# Patient Record
Sex: Female | Born: 1975 | Race: Black or African American | Hispanic: No | Marital: Married | Smoking: Never smoker
Health system: Southern US, Community
[De-identification: ages and names within clinical notes are randomized; demographics above are authoritative.]

## PROBLEM LIST (undated history)

## (undated) DIAGNOSIS — R011 Cardiac murmur, unspecified: Secondary | ICD-10-CM

## (undated) DIAGNOSIS — Z87442 Personal history of urinary calculi: Secondary | ICD-10-CM

## (undated) DIAGNOSIS — D509 Iron deficiency anemia, unspecified: Secondary | ICD-10-CM

## (undated) DIAGNOSIS — T8859XA Other complications of anesthesia, initial encounter: Secondary | ICD-10-CM

## (undated) DIAGNOSIS — I480 Paroxysmal atrial fibrillation: Secondary | ICD-10-CM

## (undated) DIAGNOSIS — E119 Type 2 diabetes mellitus without complications: Secondary | ICD-10-CM

## (undated) DIAGNOSIS — Z8774 Personal history of (corrected) congenital malformations of heart and circulatory system: Secondary | ICD-10-CM

## (undated) DIAGNOSIS — I440 Atrioventricular block, first degree: Secondary | ICD-10-CM

## (undated) DIAGNOSIS — Z7901 Long term (current) use of anticoagulants: Secondary | ICD-10-CM

## (undated) DIAGNOSIS — N200 Calculus of kidney: Secondary | ICD-10-CM

## (undated) DIAGNOSIS — N921 Excessive and frequent menstruation with irregular cycle: Secondary | ICD-10-CM

## (undated) DIAGNOSIS — I10 Essential (primary) hypertension: Secondary | ICD-10-CM

## (undated) DIAGNOSIS — T4145XA Adverse effect of unspecified anesthetic, initial encounter: Secondary | ICD-10-CM

## (undated) DIAGNOSIS — I340 Nonrheumatic mitral (valve) insufficiency: Secondary | ICD-10-CM

## (undated) DIAGNOSIS — Z8632 Personal history of gestational diabetes: Secondary | ICD-10-CM

## (undated) HISTORY — PX: TRANSTHORACIC ECHOCARDIOGRAM: SHX275

## (undated) HISTORY — DX: Essential (primary) hypertension: I10

## (undated) HISTORY — PX: WISDOM TOOTH EXTRACTION: SHX21

## (undated) HISTORY — PX: ASD REPAIR, OSTIUM PRIMUM: SHX1194

---

## 1999-03-14 ENCOUNTER — Emergency Department (HOSPITAL_COMMUNITY): Admission: EM | Admit: 1999-03-14 | Discharge: 1999-03-14 | Payer: Self-pay

## 1999-03-21 ENCOUNTER — Emergency Department (HOSPITAL_COMMUNITY): Admission: EM | Admit: 1999-03-21 | Discharge: 1999-03-21 | Payer: Self-pay | Admitting: Emergency Medicine

## 1999-03-24 ENCOUNTER — Emergency Department (HOSPITAL_COMMUNITY): Admission: EM | Admit: 1999-03-24 | Discharge: 1999-03-24 | Payer: Self-pay | Admitting: Emergency Medicine

## 2001-12-26 ENCOUNTER — Encounter: Admission: RE | Admit: 2001-12-26 | Discharge: 2001-12-26 | Payer: Self-pay | Admitting: Family Medicine

## 2001-12-26 ENCOUNTER — Encounter: Payer: Self-pay | Admitting: Family Medicine

## 2001-12-27 ENCOUNTER — Encounter: Payer: Self-pay | Admitting: Emergency Medicine

## 2001-12-27 ENCOUNTER — Inpatient Hospital Stay (HOSPITAL_COMMUNITY): Admission: EM | Admit: 2001-12-27 | Discharge: 2001-12-28 | Payer: Self-pay | Admitting: Emergency Medicine

## 2004-05-06 ENCOUNTER — Other Ambulatory Visit: Admission: RE | Admit: 2004-05-06 | Discharge: 2004-05-06 | Payer: Self-pay | Admitting: Obstetrics and Gynecology

## 2005-02-24 ENCOUNTER — Inpatient Hospital Stay (HOSPITAL_COMMUNITY): Admission: EM | Admit: 2005-02-24 | Discharge: 2005-02-28 | Payer: Self-pay | Admitting: Emergency Medicine

## 2005-02-25 ENCOUNTER — Encounter (INDEPENDENT_AMBULATORY_CARE_PROVIDER_SITE_OTHER): Payer: Self-pay | Admitting: Cardiovascular Disease

## 2005-03-09 HISTORY — PX: CARDIOVASCULAR STRESS TEST: SHX262

## 2005-04-27 HISTORY — PX: CARDIAC CATHETERIZATION: SHX172

## 2006-07-07 IMAGING — CR DG CHEST 2V
2 series · 2 of 2 positions shown · non-contrast
Comparison: 02/25/05.

CLINICAL DATA: Chest pain and history of congenital heart disease. 
 2-VIEW CHEST:

[w chest pa]
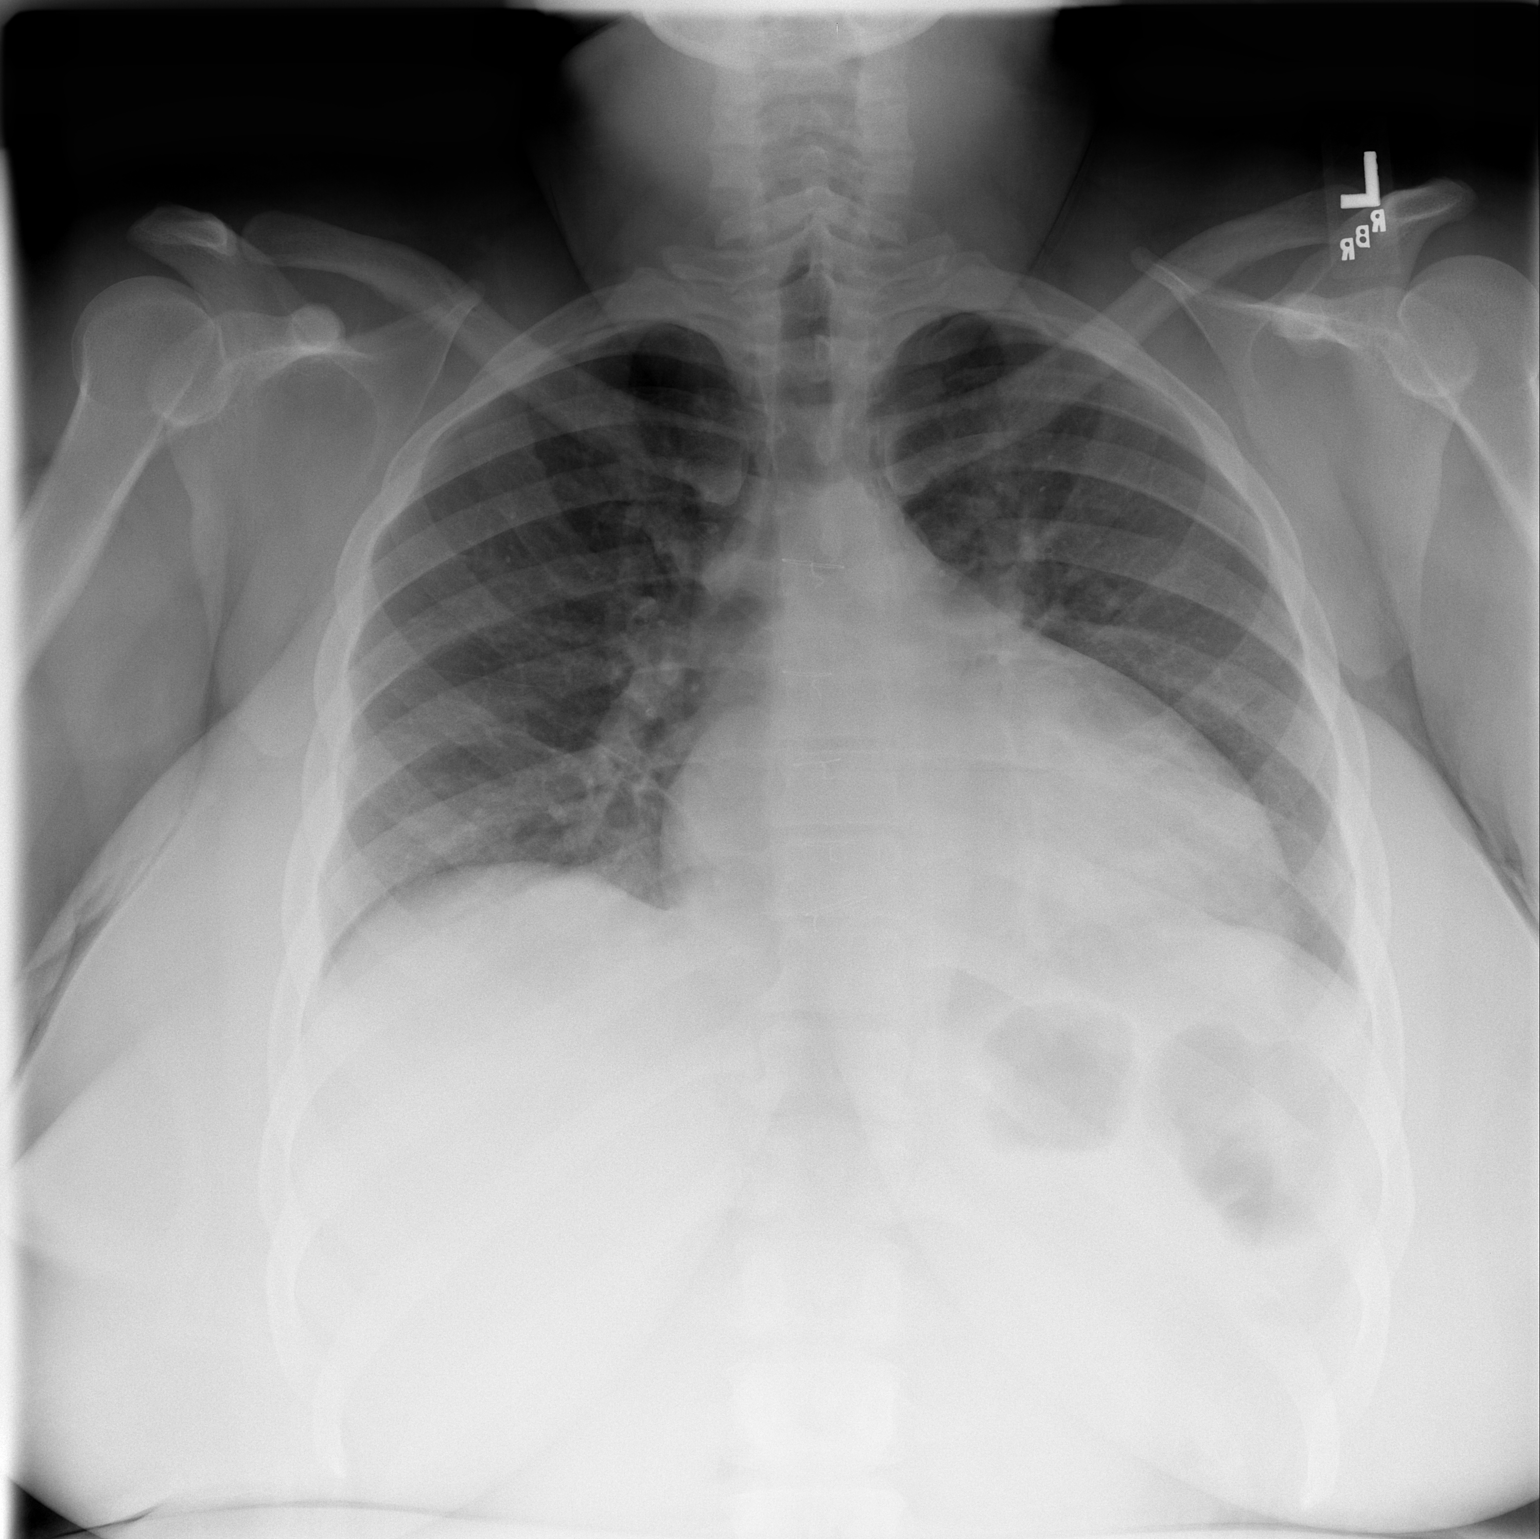

[w chest lat]
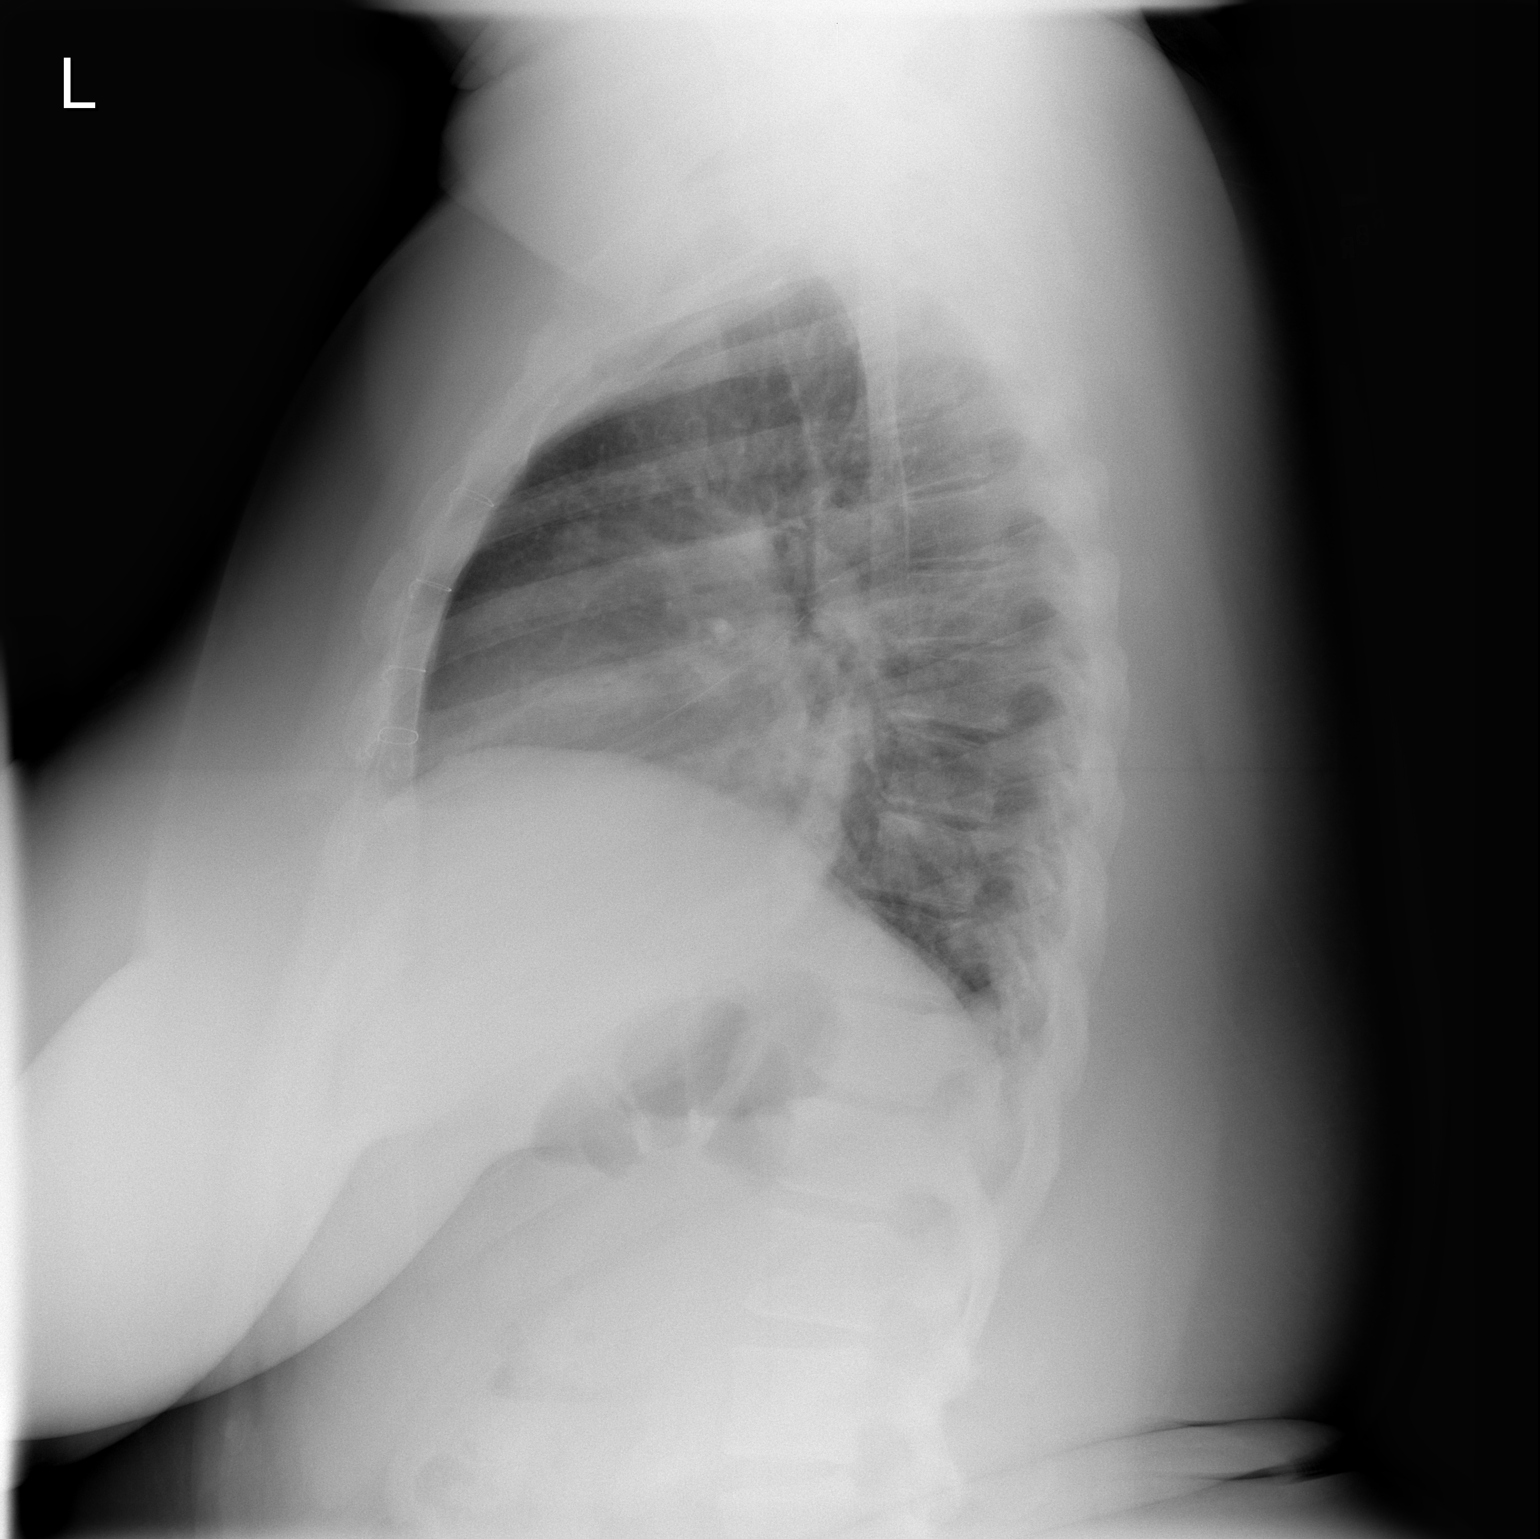

[2 of 2 positions shown; findings below may reference images not displayed]

There are low bilateral lung volumes.  No overt edema.  No pleural effusions.  Underlying cardiomegaly is slightly more pronounced compared to the 02/25/05 film.  The patient has had prior median sternotomy.
IMPRESSION: Cardiomegaly and low lung volumes.  No overt edema.

## 2006-11-07 DIAGNOSIS — Z8632 Personal history of gestational diabetes: Secondary | ICD-10-CM

## 2006-11-07 HISTORY — DX: Personal history of gestational diabetes: Z86.32

## 2007-04-26 ENCOUNTER — Encounter: Admission: RE | Admit: 2007-04-26 | Discharge: 2007-04-26 | Payer: Self-pay | Admitting: Obstetrics and Gynecology

## 2007-07-30 ENCOUNTER — Inpatient Hospital Stay (HOSPITAL_COMMUNITY): Admission: AD | Admit: 2007-07-30 | Discharge: 2007-07-30 | Payer: Self-pay | Admitting: Obstetrics and Gynecology

## 2007-08-02 ENCOUNTER — Inpatient Hospital Stay (HOSPITAL_COMMUNITY): Admission: RE | Admit: 2007-08-02 | Discharge: 2007-08-05 | Payer: Self-pay | Admitting: Obstetrics and Gynecology

## 2007-08-02 ENCOUNTER — Encounter (INDEPENDENT_AMBULATORY_CARE_PROVIDER_SITE_OTHER): Payer: Self-pay | Admitting: Obstetrics and Gynecology

## 2007-08-20 ENCOUNTER — Encounter: Admission: RE | Admit: 2007-08-20 | Discharge: 2007-09-19 | Payer: Self-pay | Admitting: Obstetrics and Gynecology

## 2007-09-20 ENCOUNTER — Encounter: Admission: RE | Admit: 2007-09-20 | Discharge: 2007-10-19 | Payer: Self-pay | Admitting: Obstetrics and Gynecology

## 2007-10-20 ENCOUNTER — Encounter: Admission: RE | Admit: 2007-10-20 | Discharge: 2007-11-11 | Payer: Self-pay | Admitting: Obstetrics and Gynecology

## 2010-11-29 ENCOUNTER — Encounter: Payer: Self-pay | Admitting: Obstetrics and Gynecology

## 2011-01-25 HISTORY — PX: CARDIOVERSION: SHX1299

## 2011-02-04 ENCOUNTER — Ambulatory Visit (HOSPITAL_COMMUNITY)
Admission: RE | Admit: 2011-02-04 | Discharge: 2011-02-04 | Disposition: A | Discharge status: Home / Self Care | Payer: BC Managed Care – PPO | Source: Ambulatory Visit | Attending: Internal Medicine | Admitting: Internal Medicine

## 2011-02-04 DIAGNOSIS — Z7901 Long term (current) use of anticoagulants: Secondary | ICD-10-CM | POA: Insufficient documentation

## 2011-02-04 DIAGNOSIS — R0602 Shortness of breath: Secondary | ICD-10-CM | POA: Insufficient documentation

## 2011-02-04 DIAGNOSIS — I4892 Unspecified atrial flutter: Secondary | ICD-10-CM | POA: Insufficient documentation

## 2011-02-04 LAB — PROTIME-INR: INR: 1.94 — ABNORMAL HIGH (ref 0.00–1.49)

## 2011-02-04 LAB — BASIC METABOLIC PANEL
GFR calc Af Amer: >60 mL/min (ref >60)
GFR calc non Af Amer: >60 mL/min (ref >60)
Potassium: 4.1 mEq/L (ref 3.5–5.1)
Sodium: 138 mEq/L (ref 135–145)

## 2011-02-04 LAB — APTT: aPTT: 50 seconds — ABNORMAL HIGH (ref 24–37)

## 2011-02-04 LAB — CBC: MCV: 81.3 fL (ref 78.0–100.0)

## 2011-02-09 NOTE — Procedures (Signed)
  NAMESINDI, BECKWORTH             ACCOUNT NO.:  [id number]  MEDICAL RECORD NO.:  [id number]           PATIENT TYPE:  O  LOCATION:  [facility]                         FACILITY:  [facility]  PHYSICIAN:  [place] Norvin Ohlin, MD         DATE OF BIRTH:  [birth year]  DATE OF PROCEDURE:  02/04/2011 DATE OF DISCHARGE:  02/04/2011                           CARDIAC CATHETERIZATION   This is an elective cardioversion.  BACKGROUND INFORMATION:  Joy Le is a 35 year old female who had a history of atrial flutter paroxysmally and presented in atrial flutter to the office today feeling of fatigue and somewhat short of breath. She had been on diltiazem for rate control, but is not currently on an antiarrhythmic agent.  She has been therapeutic with Coumadin for approximately 1 year and therefore was referred for elective cardioversion today.  After procedural time-out, the patient was anesthetized per Anesthesia Service with IV anesthesia.  The left AC IV was noted to be infiltrated and a separate IV was placed in the left wrist.  After adequate sedation was achieved, the patient underwent cardioversion with one synchronized shock of 150 joules, biphasic.  The rhythm was noted to convert from atrial flutter to sinus rhythm.  There were no acute complications. Afterwards, the patient was recovered by the anesthesia staff and once able, will be discharged home in a stable condition.  She is asked to follow up with Dr. Royann Shivers in the office next week.     Italy Udell Blasingame, MD     CH/MEDQ  D:  02/04/2011  T:  02/05/2011  Job:  [id number]  cc:   Thurmon Fair, MD  Electronically Signed by Kirtland Bouchard. Almadelia Looman M.D. on 02/09/2011 08:56:12 AM

## 2011-03-22 NOTE — Op Note (Signed)
NAMESEKAI, NAYAK             ACCOUNT NO.:  [id number]   MEDICAL RECORD NO.:  [id number]          PATIENT TYPE:  INP   LOCATION:  9103                          [facility]ACILITY[facility]   PHYSICIAN:  Janine Limbo, M.D.DATE OF BIRTH:  [birth year]   DATE OF PROCEDURE:  08/02/2007  DATE OF DISCHARGE:                               OPERATIVE REPORT   PREOPERATIVE DIAGNOSES:  1. Gestation of 37-1/2 weeks.  2. Insulin-dependent gestational diabetes.  3. Chronic hypertension.  4. Obesity (weight 304 pounds, height 5 feet 2 inches).  5. Breech presentation.  6. Possible intrauterine growth retardation.  7. Asthma.   POSTOPERATIVE DIAGNOSES:  1. Gestation of 37-1/2 weeks.  2. Insulin-dependent gestational diabetes.  3. Chronic hypertension.  4. Obesity (weight 304 pounds, height 5 feet 2 inches).  5. Breech presentation.  6. Possible intrauterine growth retardation.  7. Asthma.   PROCEDURE:  Primary low transverse cesarean section.   SURGEON:  Janine Limbo, M.D.   FIRST ASSISTANT:  Marie L. Williams, C.N.M.   ANESTHETIC:  General.   DISPOSITION:  Ms. Kniskern is a 35 year old female who presents with the  above-mentioned diagnosis.  An amniocentesis was performed and fetal  lung maturity was documented.  We talked about possible external version  and we talked about trying to observe the pregnancy with hopes that the  infant would turn on its own.  The patient considered all of her options  and elected to proceed at this point with a cesarean delivery because of  concerns for poor growth of her infant.  She also understood that it was  unlikely that her infant would turn on her own at this point and that it  was unlikely that we would be successful with an external version.  The  patient understood and accepted the risk of, but not limited to,  anesthetic complications, bleeding, infections, and possible damage to  surrounding organs.   FINDINGS:  A 5 pound 1 ounce female  infant Nehemiah Settle) was delivered from a  frank breech presentation.  The Apgars were 8 at one minute and 9 at  five minutes.  A nuchal cord was present.  The uterus, fallopian tubes,  and the ovaries appeared normal for the gravid state.   PROCEDURE:  The patient was taken to the operating room, where a spinal  anesthetic was given.  The patient's abdomen, perineum, and vagina were  prepped with multiple layers of Betadine.  A Foley catheter was placed  in the bladder.  The patient was then sterilely draped.  The lower  abdomen was injected with 0.5% Marcaine with epinephrine, 10 mL were  used.  A low transverse incision was made in the abdomen and carried  sharply through the subcutaneous tissue, the fascia, and the anterior  peritoneum.  The bladder flap was developed.  An incision was made in  the lower uterine segment and extended in a low transverse fashion.  The  infant was delivered from a frank breech presentation.  The nuchal cord  was reduced.  The cord was clamped and cut and the infant was handed to  the awaiting  pediatric team.  Routine cord blood studies were obtained.  The placenta was removed.  The uterine cavity was cleaned of amniotic  fluid, clotted blood, and membranes.  The uterine incision was then  closed using a running locking suture of 2-0 Vicryl followed by an  imbricating suture of 2-0 Vicryl.  Hemostasis was adequate.  The pelvis  was vigorously irrigated.  The anterior peritoneum and the abdominal  musculature were reapproximated in the midline using 3-0 Vicryl.  The  fascia was closed using a running suture of 0 Vicryl followed by three  interrupted sutures of 0 Vicryl.  A Jackson-Pratt drain was placed in  the subcutaneous layer and brought out through the left lower quadrant.  It was sutured into place using 3-0 silk.  The subcutaneous layer was  closed using a running suture of 0 Vicryl.  The skin was reapproximated  using a subcuticular suture of 3-0  Monocryl.  Sponge, needle, instrument  counts were correct on two occasions.  The estimated blood loss for the  procedure was 600 mL.  The patient tolerated her procedure well.  She  was taken to the recovery room in stable condition.  The infant was  taken to the full-term nursery in stable condition.  The placenta was  sent to pathology.      Janine Limbo, M.D.  Electronically Signed     AVS/MEDQ  D:  08/02/2007  T:  08/03/2007  Job:  [id number]

## 2011-03-22 NOTE — Discharge Summary (Signed)
Joy Le, Joy Le             ACCOUNT NO.:  [id number]   MEDICAL RECORD NO.:  [id number]          PATIENT TYPE:  INP   LOCATION:  9103                          [facility]ILITY[facility]   PHYSICIAN:  Janine Limbo, M.D.DATE OF BIRTH:  Mar 17, 1976   DATE OF ADMISSION:  08/02/2007  DATE OF DISCHARGE:  08/05/2007                               DISCHARGE SUMMARY   ADMISSION DIAGNOSES:  1. Intrauterine pregnancy at 37-1/2 weeks.  2. Chronic hypertension.  3. Diabetes, gestational.  4. Obesity.  5. Intrauterine growth restriction.  6. Breech presentation.   DISCHARGE DIAGNOSES:  1. Intrauterine pregnancy at 37-1/2 weeks.  2. Chronic hypertension.  3. Diabetes, gestational.  4. Obesity.  5. Intrauterine growth restriction.  6. Breech presentation.   HISTORY:  [name] is a 35 year old gravida 1, para 0, at 37-2/7th  weeks, who presents for a cesarean section, secondary to breech  presentation and intrauterine growth retardation.  The patient's history  has also been remarkable for:  1. History of irregular cycles.  2. History of atrial septal defect, repaired as a child.  3. Asthma.  4. 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  5. Migraines.  6. Chronic hypertension, with the patient on Aldomet during this      pregnancy.  7. Toxoplasmosis risk.  8. Moderate to severe mitral regurgitation.  9. Group-B strep positive.   HOSPITAL COURSE:  The patient was taken to the operating room where Dr.  Janine Limbo performed a primary low transverse cesarean section  for a viable female, under spinal anesthesia.  The findings were a 5  pound 1 ounce infant with the name of [name].  Apgars were 8 and 9.  The  infant tolerated the procedure well and was taken to the full-term  nursery.  The mother was taken to the recovery room in good condition.  The placenta was sent to pathology.  There was a JP drain placed in the  left lower quadrant.   On postoperative  day one the patient was doing well.  She was up ad lib.  The blood pressures were in the 120's to 150's/68-84.  Fasting blood  sugar was 102.  A 2-hour PC was 92.  The JP was draining approximately  30 mL.  The Foley was draining clear urine.  She was on Aldomet 500 mg  p.o. twice daily and sliding scale insulin, but none had been required.  Hemoglobin on day one was 11.6, white blood cell count 9.6 and platelet  count was 233.  These were not significantly changed from her pre-  delivery values.  Her JP was draining a moderate amount of  serosanguineous fluid.   By postoperative day two the patient was continuing to do well.  Fasting  blood sugar was 100, 2-hour PCs were in the 140s to 180s.  Her incision  was clean, dry and intact.  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  By  postoperative day three the patient was doing well.  She was up ad lib.  The JP drain was removed by [name], without difficulty.  Her  fasting  blood sugar was 110.  The decision was made to maintain her  Aldomet but discontinue her insulin.  The patient was to check her  fasting blood sugar at home.   DISPOSITION:  The patient was deemed to have received the full benefit  of her hospital stay.  Her incision was clean, dry and intact.  Her  abdomen was soft.  She was discharged home in good condition.   DISCHARGE INSTRUCTIONS:  1. The patient is to monitor her blood sugar on a fasting level every      day.  2. Insulin is to be discontinued.  3. The patient is to monitor for PIH symptoms or any symptoms or      issues with her blood sugar.   DISCHARGE MEDICATIONS:  1. Motrin 600 mg p.o. q.6h. p.r.n. pain.  2. Percocet 5/325 mg p.o., one to two q.3-4h. p.r.n. pain.  3. Aldomet 500 mg p.o. twice daily.   FOLLOWUP:  Will occur by the [facility] nurse in one week, and then at  six weeks at [facility] or p.r.n.      Renaldo Reel Emilee Hero, C.N.M.      Janine Limbo, M.D.  Electronically  Signed    VLL/MEDQ  D:  08/05/2007  T:  08/05/2007  Job:  [id number]

## 2011-03-22 NOTE — Op Note (Signed)
Joy Le, Joy Le             ACCOUNT NO.:  [id number]   MEDICAL RECORD NO.:  [id number]          PATIENT TYPE:  MAT   LOCATION:  [facility]                          [facility]   PHYSICIAN:  Janine Limbo, M.D.DATE OF BIRTH:  02-27-1976   DATE OF PROCEDURE:  07/30/2007  DATE OF DISCHARGE:                               OPERATIVE REPORT   PREOPERATIVE DIAGNOSES:  1. 36-week and 6-day gestation.  2. Hypertension.  3. Diabetes of pregnancy.  4. Intrauterine growth retardation.  5. Obesity (weight 302 pounds).   POSTOPERATIVE DIAGNOSES:  1. 36-week and 6-day gestation.  2. Hypertension.  3. Diabetes of pregnancy.  4. Intrauterine growth retardation.  5. Obesity (weight 302 pounds).   PROCEDURE:  Amniocentesis for maturity studies.   SURGEON:  Janine Limbo, M.D.   FIRST ASSISTANT:  None.   ANESTHESIA:  Local Xylocaine.   DISPOSITION:  Ms. Decoursey is a 35 year old female, gravida 1, para 0,  who presents with the above-mentioned diagnoses.  The patient  understands the indications for her surgical procedure, and she accepts  the risks of, but not limited to, anesthetic complications, bleeding,  infection, and possible damage to the surrounding organs.  She  understands that in the event of fetal distress, we will need to proceed  with cesarean section even without confirmation of maturity.   FINDINGS:  The patient is blood type is O positive.  On ultrasound, the  patient was noted to have a single intrauterine gestation with a breech  presentation.  The amniotic fluid volume appeared normal.  The placenta  appeared normal.  The patient was noted to have normal-appearing fetal  heart motions.   PROCEDURE:  The patient was seen in the ultrasound unit at the [facility].  An ultrasound was performed which confirmed the  above.  The patient's abdomen was prepped with Betadine and then  sterilely draped.  An appropriate fluid pocket had  previously been  identified.  The abdomen was injected with 2 mL of 1% Xylocaine.  The 20-  gauge spinal needle was inserted into the amniotic cavity under direct  visualization.  18 mL of clear amniotic fluid was removed without  difficulty.  All needles and drapes were removed.  A repeat ultrasound  was performed, and there was no evidence of damage.  The fluid was sent  to [facility] where we will obtain a PG test and an L/S  ratio.  The patient will go to maternity admissions for a nonstress  test.  We will call the patient with her test results.      Janine Limbo, M.D.  Electronically Signed     AVS/MEDQ  D:  07/30/2007  T:  07/31/2007  Job:  [id number]

## 2011-03-22 NOTE — Consult Note (Signed)
Joy Le, Joy Le             ACCOUNT NO.:  [id number]   MEDICAL RECORD NO.:  [id number]          PATIENT TYPE:  MAT   LOCATION:  [facility]                          [facility]   PHYSICIAN:  Janine Limbo, M.D.DATE OF BIRTH:  07-01-1976   DATE OF CONSULTATION:  07/30/2007  DATE OF DISCHARGE:                                 CONSULTATION   ADMISSION DIAGNOSES:  1. Thirty-six-week and 6-day gestation.  2. Hypertension.  3. Gestational diabetes.  4. Intrauterine growth retardation.  5. Breech presentation.  6. Obesity (weight 302 pounds).  7. Status post amniocentesis for maturity studies.   POSTOPERATIVE DIAGNOSES:  1. Thirty-six-week and 6-day gestation.  2. Hypertension.  3. Gestational diabetes.  4. Intrauterine growth retardation.  5. Breech presentation.  6. Obesity (weight 302 pounds).  7. Status post amniocentesis for maturity studies.   PROCEDURE THIS ADMISSION:  July 30, 2007, amniocentesis for  maturity studies.   OBSERVATION STATUS:  The patient had a successful amniocentesis for  maturity studies.  The fluid sample was sent to [facility], where she will have a PG test performed as well as an LS ratio.  A nonstress test was performed in the maternity admissions area and the  patient was noted to have a reactive nonstress test.  The patient's  vital signs remained stable and she was discharged to home in stable  condition.   FOLLOWUP INSTRUCTIONS:  [name] will call the patient with the  results of her test.  The patient will call for problems or for bleeding  was leakage of fluid.  She will monitor the movement of her infant.      Janine Limbo, M.D.  Electronically Signed     AVS/MEDQ  D:  07/30/2007  T:  07/31/2007  Job:  [id number]

## 2011-03-22 NOTE — H&P (Signed)
Joy Le, Joy Le             ACCOUNT NO.:  [id number]   MEDICAL RECORD NO.:  [id number]          PATIENT TYPE:  MAT   LOCATION:  [facility]                          [facility]   PHYSICIAN:  Janine Limbo, M.D.DATE OF BIRTH:  [birth year]   DATE OF ADMISSION:  07/30/2007  DATE OF DISCHARGE:  07/30/2007                              HISTORY & PHYSICAL   PRIORITY PREADMISSION HISTORY AND PHYSICAL   HISTORY OF PRESENT ILLNESS:  Joy Le is a 34 year old female,  gravida 1, para 0, who presents at 13 and 1/[redacted] weeks gestation (EDC is  August 21, 2007).  The patient has been followed at the [facility].  The patient's pregnancy has been complicated by the fact that  she had an atrioseptal defect repair at age [age].  She also has insulin-  dependent gestational diabetes.  She has a history of chronic  hypertension and currently takes Aldomet 500 mg twice each day.  Her  current insulin requirements include 4 units of NPH insulin in the  morning and 8 units of NPH insulin at bedtime.  On July 26, 2007,  an ultrasound was performed that showed her infant to be in a breech  presentation.  The biophysical profile was noted to be 8 out of 8.  However, the estimated fetal weight at that time was 4 pounds and 12  ounces, which was less than the 10th percentile.  An amniocentesis was  performed on September 22nd that showed a positive PG test.  She was  also noted to have a mature LS ratio.   DRUG ALLERGIES:  No known drug allergies, latex allergies, or Betadine  allergies.   PAST MEDICAL HISTORY:  1. The patient had an atrioseptal defect repaired at age [age].      Antibiotics are recommended.  2. The patient has a history of asthma.  3. She has a history of migraine headaches.  4. She has a past history of insulin resistance.   SOCIAL HISTORY:  The patient denies cigarette use, alcohol use, and  recreational drug use.   REVIEW OF SYSTEMS:  Normal pregnancy complaints.   FAMILY HISTORY:  Noncontributory.   PHYSICAL EXAMINATION:  VITAL SIGNS:  Height is 5 feet 2 inches, weight  is 304 pounds.  HEENT:  Within normal limits.  CHEST:  Clear.  HEART:  Regular rate and rhythm.  BREASTS:  Without masses.  ABDOMEN:  Gravid with a fundal height that is significant because of the  patient's large panniculus.  EXTREMITIES:  Grossly normal.  NEUROLOGIC EXAM:  Grossly normal.   PELVIC EXAM:  Cervix was closed and long when last checked.   LABORATORY VALUES:  Blood type is O-positive, antibody screen negative,  sickle cell negative, toxoplasmosis negative, VDRL nonreactive, Rubella  immune, HBSAG negative, HIV nonreactive, third trimester beta Strep is  positive, third trimester gonorrhea negative, third trimester Chlamydia  is negative.   ASSESSMENT:  1. Thirty-seven and one-half week gestation.  2. Insulin-requiring gestational diabetes.  3. Chronic hypertension.  4. Obesity.  5. Breech presentation.  6.  Possible intrauterine growth retardation.  7. Asthma.  8. Past history of atrioseptal defect repair.  9. Mature lecithin sphingomyelin ratio (LS) ratio with      phosphatidylglycerol (PG).   PLAN:  The patient will undergo a primary low transverse cesarean  section.  We offered the patient an attempt at external version, but  this was declined.  We discussed the fact that there is a possibility  that her baby may turn on its own, and one option we have for management  is to continue observation.  She declines continued observation at this  point as well.  The patient understands the indications for her surgical  procedure, and she accepts the risk of, but not limited to, anesthetic  complications, bleeding, infections, and possible damage to the  surrounding organs.  Date of the surgery is August 02, 2007.      Janine Limbo, M.D.  Electronically Signed      AVS/MEDQ  D:  08/01/2007  T:  08/01/2007  Job:  [id number]

## 2011-03-25 NOTE — Discharge Summary (Signed)
Joy Le, PEDEN NO.:  [id number]   MEDICAL RECORD NO.:  [id number]          PATIENT TYPE:  INP   LOCATION:  3735                         FACILITY:  [facility]   PHYSICIAN:  Darlin Priestly, MD  DATE OF BIRTH:  09-Nov-1975   DATE OF ADMISSION:  02/24/2005  DATE OF DISCHARGE:  02/28/2005                                 DISCHARGE SUMMARY   Joy Le is a 35 year old African-American female.   ADMISSION DIAGNOSES:  1.  Chest pain.  2.  Relative tachycardia resting heart rate in the 90-100s.  3.  History of atrial septal defect repair at about age [age].  4.  History of asthma.  5.  Metrorrhagia.  6.  Insulin resistant.  7.  Significant obesity.  8.  History of past migraines.   DISCHARGE DIAGNOSES:  1.  Chest pain.  2.  Relative tachycardia resting heart rate in the 90-100s.  3.  History of atrial septal defect repair at about age [age].  4.  History of asthma.  5.  Metrorrhagia.  6.  Insulin resistant.  7.  Significant obesity.  8.  History of past migraines.  9.  Status post TEE on February 28, 2005 revealing moderate to moderate-severe      mitral regurgitation.  Normal left ventricular function.  No vegetation.   HISTORY OF PRESENT ILLNESS:  Joy Le is a 35 year old African-American  female  with history of an ASD repair at about age 4 done at [facility].  She saw a cardiologist in [facility] named Dr. Delon Sacramento who  has subsequently retired.  She had seen him annually up until college.  Otherwise, she was lost to cardiac followup.  The only other cardiac  evaluation she had was with Dr. Garnette Scheuermann in 2001.  At that time she had  been experiencing palpitations and saw him and had an EKG, echocardiogram,  and event monitor.  She was told that everything was normal for her  condition and has had no further cardiac followup.   She says that since Monday of that week she had been experiencing some  allergies and congestion in her sinuses  and head and had been using some  Singulair for that.  No other problems with shortness of breath otherwise.   She says that on the day of admission at about 11:30 a.m. she was starting  to experiencing tightness in the middle chest.  There was no radiation.  She  did feel some shortness of breath because of the nasal congestion, but no  other shortness of breath.  The chest tightness was constant for the first  three or four hours.  By the time of our evaluation, was coming and going.  She had also felt some palpitations and shortness of breath and presyncope  at the time of onset of tightness.   Earlier in the day she went to urgent care.  They had sent her to  [facility] for a CT scan which was subsequently negative for  pulmonary embolism or aortic dissection.  Labs drawn at urgent care were  significant for an elevated white  count. The other laboratories they drew  had results pending.  She stated that the chest x-ray showed some  cardiomegaly and her EKG showed a rapid heart rate.   She also notes that she has been having menstruation over the last six  weeks.  She says this is a chronic problem for her to have heavy menses.  It  is not sure if that might be contributing to her problem.   PHYSICAL EXAMINATION:  VITAL SIGNS:  Heart rate 114, blood pressure 143/87.  GENERAL:  Noted for significantly obese female.  HEART:  EKG showed sinus tachycardia at 107 beats per minute, first-degree  AV block, interventricular conduction delay, and lateral T-wave inversion.   We reviewed labs from the prime cam and findings were notable for white  count 18,000.   At the time of review CT scan had been reportedly  negative.   At this point she was seen and evaluated by Dr. Lenise Herald.  He planned  to admit her to telemetry, check enzymes, rule out myocardial infarction.  Would obtain sputum cultures and blood cultures and then start empiric  Rocephin given her high white  count and her sinus complaints.  We plan to  get a 2-D echocardiogram to re-evaluate her ASD repair and her EF.  Plan to  start some Coreg for her relative tachycardia.   HOSPITAL COURSE:  On February 25, 2005 she reported that she had some increased  chest tightness earlier that morning, but was better by the time  of our  evaluation.  It seemed to be positional to her.  White count was 21.6.  Enzymes negative.  At this point, we are awaiting 2-D echocardiogram.   On March 03, 2005 she was feeling improved.  The echocardiogram was poor  quality.  Dr. Tresa Endo felt that we would need to schedule her for a TEE on  Monday as the echo had poor visualization because of body habitus.   On February 27, 2005 she was still improving and feeling improved.  White count  was much improved as well.  She was awaiting TEE the following morning.   On Monday, February 28, 2005, she underwent TEE by Dr. Jacinto Halim.  See his dictated  report for detail.  The atrial septum was intact.  She was found to have  moderate to moderately severe MR.  There was no vegetation, otherwise  normal.  Please see his dictated report for all detail.   Post procedure was followed by Dr. Jacinto Halim and the patient could be discharged  home.  He did discuss this with Dr. Jenne Campus and it was felt that she would  need an outpatient Cardiolite scan to rule out ischemic etiology of her  original chest tightness symptoms.   HOSPITAL CONSULTS:  None.   HOSPITAL PROCEDURES:  TEE on February 28, 2005 by Dr. Jeanella Cara.  See dictated  report for detail.   RADIOLOGY:  She had her outpatient CT scan done prior to admission.  This  was reportedly negative for pulmonary embolus.  Chest x-ray February 25, 2005  shows mild cardiomegaly.  Low volumes with bibasilar atelectasis and  vascular congestion.  Chest x-ray February 27, 2005 shows cardiomegaly and low  lung volumes, no overt edema.  LABORATORY DATA:  On admission white count was 22.2, hemoglobin 11.8,   hematocrit 34.8, platelets 269.  Percent neutrophils is elevated at 87.  Cardiac enzymes are negative with CKs of 63 and 52; MB 0.4, 0.4, troponin  0.01, 0.03.  On admission sodium 132, potassium 3.4 which was repleted, BUN  5, creatinine 0.7.  Liver function tests normal.  BNP 73.4.  Urine pregnancy  test negative.  Urine drug screen negative.  TSH normal.  Blood cultures  show no growth up through the time of discharge home.  Sputum culture shows  no growth.  Lipid profile shows total cholesterol 155, triglycerides 116,  HDL 51, LDL 81.  CBC on February 28, 2005 shows white count 9.9, hemoglobin  10.4, hematocrit 31.8, platelet 309.   DISCHARGE MEDICATIONS:  1.  Metformin 500 mg three times daily as before.  2.  Singulair as before.  3.  Coreg 6.25 mg twice daily.   She is scheduled for Persantine Cardiolite on our office two-day protocol.  This is for Wednesday, May 3 at 1:15 p.m. and Thursday May 4 at 1:15 p.m.  She is scheduled to see Dr. Jenne Campus back May 18 at 4 p.m.   She has been informed about SBE prophylaxis.   She is given the address and phone number of the office.      MBE/MEDQ  D:  02/28/2005  T:  02/28/2005  Job:  [id number]   cc:   Gabriel Earing, M.D.  [address]  [place]  [place] [address]  Fax: [phone]

## 2011-03-25 NOTE — H&P (Signed)
[facility]  Patient:    Joy Le, Joy Le Visit Number: [id number] MRN: [id number]          Service Type: Attending:  Marcelino Duster, M.D. Dictated by:   Marcelino Duster, M.D. Adm. Date:  12/27/01   CC:         Gretta Arab. Valentina Lucks, M.D., [facility]  Darci Needle, M.D., [facility]   History and Physical  CHIEF COMPLAINT:  Shortness of breath and chest pain.  HISTORY OF PRESENT ILLNESS:  This is a 35 year old black female who presents to the [facility] emergency department with increasing shortness of breath and right-sided chest pain.  The patient states symptoms started 2 days ago with some nausea, vomiting, and diarrhea which has resolved.  She had cough yesterday productive of yellow sputum and some right-sided chest pain.  She went to see her primary care physician and had a chest x-ray done there and was diagnosed with pneumonia and started on doxycycline.  She comes in tonight with increasing shortness of breath and worsening of her chest pain.  Upon presentation tonight her O2 saturations was found to be 91% on room air.  PAST MEDICAL HISTORY: 1. Asthma. 2. Atrial septal defect.    a. Status post surgical repair with 1 defect left.  ALLERGIES:  CODEINE.  CURRENT MEDICATIONS: 1. Advair 1 puff b.i.d. 2. Tessalon perles q.8h. 3. duratuss 1200 mg b.i.d. 4. Darvocet-N 100 q.4h. p.r.n. 5. Doxycycline 100 mg b.i.d.  SOCIAL HISTORY:  She does not smoke or drink any alcohol.  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  FAMILY HISTORY:  Mother is 10 years old with hypertension.  Father is 66 years old with peptic ulcer disease.  REVIEW OF SYSTEMS:  As per history of present illness.   All other systems normal.  PHYSICAL EXAMINATION:  VITAL SIGNS:  Temperature is 101.6.  Pulse is 127.  Respirations 44.  Blood pressure 133/77.  O2 saturation is 91% on room air.  GENERAL:  This is a mildly obese black female in no  acute distress.  HEENT:  Pupils, equal, round, reactive to light.  Oral mucosa moist.  Tympanic membranes clearly visible, nonbulging.  Oropharynx is clear.  NECK:  Supple.  No JVD.  CARDIOVASCULAR:  Regular rate and rhythm.  There is a fixed split S2 and a 1/6 holosystolic murmur.  LUNGS:  Decreased breath sounds in the left base.  No wheezing.  ABDOMEN:  Soft, nontender, nondistended.  Bowel sounds are positive.  EXTREMITIES:  No edema.  NEUROLOGIC:  Cranial nerves II-XII are grossly intact.  No focal deficits.  ADMITTING LABORATORY DATA:  White blood cell count is 42,600 with ANC of 40.6, hemoglobin is 13.6, platelets 241.  Chest x-ray shows left lower lobe infiltrate.  ASSESSMENT AND PLAN: 1. Left lower lobe pneumonia.  Will start Rocephin 1 gram and Zithromax 500 mg    IV.  Will give O2 supplementation for the hypoxia.  Because of her    complaints of chest pain she may be splinting some which may contributing    to her hypoxia.  She is an otherwise healthy young woman in appears to be    in no acute distress.  If her O2 saturation pick up may be able to    discharge her in the next 24-48 hours. 2. Asthma.  The patient is currently not wheezing so we will continue her on    her advair and will add albuterol nebulizers on a p.r.n.  basis if needed. 3. Chest pain. T his is secondary to her pneumonia.  Will treat with Vicodin. Dictated by:   Marcelino Duster, M.D. Attending:  Marcelino Duster, M.D. DD:  12/27/01 TD:  12/27/01 Job: [id number]

## 2011-08-13 ENCOUNTER — Emergency Department (HOSPITAL_COMMUNITY)
Admission: EM | Admit: 2011-08-13 | Discharge: 2011-08-14 | Disposition: A | Discharge status: Home / Self Care | Payer: No Typology Code available for payment source | Attending: Emergency Medicine | Admitting: Emergency Medicine

## 2011-08-13 ENCOUNTER — Emergency Department (HOSPITAL_COMMUNITY): Payer: No Typology Code available for payment source

## 2011-08-13 DIAGNOSIS — M25519 Pain in unspecified shoulder: Secondary | ICD-10-CM | POA: Insufficient documentation

## 2011-08-13 DIAGNOSIS — Z9889 Other specified postprocedural states: Secondary | ICD-10-CM | POA: Insufficient documentation

## 2011-08-13 DIAGNOSIS — I10 Essential (primary) hypertension: Secondary | ICD-10-CM | POA: Insufficient documentation

## 2011-08-13 DIAGNOSIS — R55 Syncope and collapse: Secondary | ICD-10-CM | POA: Insufficient documentation

## 2011-08-13 DIAGNOSIS — J45909 Unspecified asthma, uncomplicated: Secondary | ICD-10-CM | POA: Insufficient documentation

## 2011-08-13 DIAGNOSIS — M25569 Pain in unspecified knee: Secondary | ICD-10-CM | POA: Insufficient documentation

## 2011-08-13 DIAGNOSIS — R079 Chest pain, unspecified: Secondary | ICD-10-CM | POA: Insufficient documentation

## 2011-08-13 DIAGNOSIS — E8881 Metabolic syndrome: Secondary | ICD-10-CM | POA: Insufficient documentation

## 2011-08-13 DIAGNOSIS — Z7901 Long term (current) use of anticoagulants: Secondary | ICD-10-CM | POA: Insufficient documentation

## 2011-08-13 DIAGNOSIS — S8990XA Unspecified injury of unspecified lower leg, initial encounter: Secondary | ICD-10-CM | POA: Insufficient documentation

## 2011-08-13 LAB — BASIC METABOLIC PANEL
CO2: 24 mEq/L (ref 19–32)
Calcium: 9.2 mg/dL (ref 8.4–10.5)
Chloride: 105 mEq/L (ref 96–112)
GFR calc Af Amer: >90 mL/min (ref >90)
Sodium: 140 mEq/L (ref 135–145)

## 2011-08-13 LAB — CBC
HCT: 36.9 % (ref 36.0–46.0)
Hemoglobin: 12.1 g/dL (ref 12.0–15.0)
RDW: 15.9 % — ABNORMAL HIGH (ref 11.5–15.5)
WBC: 10.1 10*3/uL (ref 4.0–10.5)

## 2011-08-13 LAB — DIFFERENTIAL
Basophils Absolute: 0 10*3/uL (ref 0.0–0.1)
Basophils Relative: 0 % (ref 0–1)
Lymphocytes Relative: 37 % (ref 12–46)
Neutro Abs: 5.8 10*3/uL (ref 1.7–7.7)

## 2011-08-13 LAB — PROTIME-INR
INR: 1.54 — ABNORMAL HIGH (ref 0.00–1.49)
Prothrombin Time: 18.8 seconds — ABNORMAL HIGH (ref 11.6–15.2)

## 2011-08-13 LAB — POCT PREGNANCY, URINE: Preg Test, Ur: NEGATIVE

## 2011-08-18 LAB — CBC
HCT: 33.4 — ABNORMAL LOW
HCT: 34.5 — ABNORMAL LOW
MCHC: 33.6
MCV: 86.2
Platelets: 233
Platelets: 244
RDW: 14.4 — ABNORMAL HIGH
WBC: 10.2
WBC: 9.6

## 2011-08-18 LAB — URINALYSIS, ROUTINE W REFLEX MICROSCOPIC
Leukocytes, UA: NEGATIVE
Nitrite: NEGATIVE
Protein, ur: NEGATIVE
Specific Gravity, Urine: 1.025
Urobilinogen, UA: 0.2

## 2011-08-18 LAB — URINE MICROSCOPIC-ADD ON

## 2011-08-18 LAB — LSPG (L/S RATIO WITH PG)-AMNIO FLUID

## 2011-09-12 ENCOUNTER — Ambulatory Visit: Payer: BC Managed Care – PPO | Attending: Family Medicine | Admitting: Rehabilitative and Restorative Service Providers"

## 2011-09-12 DIAGNOSIS — IMO0001 Reserved for inherently not codable concepts without codable children: Secondary | ICD-10-CM | POA: Insufficient documentation

## 2011-09-12 DIAGNOSIS — M25669 Stiffness of unspecified knee, not elsewhere classified: Secondary | ICD-10-CM | POA: Insufficient documentation

## 2011-09-12 DIAGNOSIS — M25569 Pain in unspecified knee: Secondary | ICD-10-CM | POA: Insufficient documentation

## 2011-09-13 ENCOUNTER — Ambulatory Visit: Payer: BC Managed Care – PPO | Admitting: Rehabilitative and Restorative Service Providers"

## 2011-09-14 ENCOUNTER — Encounter: Payer: BC Managed Care – PPO | Admitting: Rehabilitation

## 2011-09-19 ENCOUNTER — Ambulatory Visit: Payer: BC Managed Care – PPO | Admitting: Rehabilitation

## 2011-09-23 ENCOUNTER — Ambulatory Visit: Payer: BC Managed Care – PPO | Admitting: Rehabilitation

## 2011-09-26 ENCOUNTER — Ambulatory Visit: Payer: BC Managed Care – PPO | Admitting: Rehabilitative and Restorative Service Providers"

## 2011-09-28 ENCOUNTER — Ambulatory Visit: Payer: BC Managed Care – PPO | Admitting: Rehabilitation

## 2011-10-03 ENCOUNTER — Ambulatory Visit: Payer: BC Managed Care – PPO | Admitting: Rehabilitation

## 2011-10-05 ENCOUNTER — Ambulatory Visit: Payer: BC Managed Care – PPO | Admitting: Rehabilitation

## 2011-10-10 ENCOUNTER — Ambulatory Visit: Payer: BC Managed Care – PPO | Attending: Family Medicine | Admitting: Rehabilitative and Restorative Service Providers"

## 2011-10-10 DIAGNOSIS — M25669 Stiffness of unspecified knee, not elsewhere classified: Secondary | ICD-10-CM | POA: Insufficient documentation

## 2011-10-10 DIAGNOSIS — IMO0001 Reserved for inherently not codable concepts without codable children: Secondary | ICD-10-CM | POA: Insufficient documentation

## 2011-10-10 DIAGNOSIS — M25569 Pain in unspecified knee: Secondary | ICD-10-CM | POA: Insufficient documentation

## 2011-10-12 ENCOUNTER — Ambulatory Visit: Payer: BC Managed Care – PPO | Admitting: Rehabilitative and Restorative Service Providers"

## 2011-10-17 ENCOUNTER — Ambulatory Visit: Payer: BC Managed Care – PPO | Admitting: Rehabilitative and Restorative Service Providers"

## 2011-10-19 ENCOUNTER — Ambulatory Visit: Payer: BC Managed Care – PPO | Admitting: Rehabilitative and Restorative Service Providers"

## 2011-10-24 ENCOUNTER — Ambulatory Visit: Payer: BC Managed Care – PPO | Admitting: Rehabilitation

## 2011-10-26 ENCOUNTER — Encounter: Payer: BC Managed Care – PPO | Admitting: Rehabilitative and Restorative Service Providers"

## 2012-01-12 ENCOUNTER — Ambulatory Visit: Payer: Self-pay | Admitting: Obstetrics and Gynecology

## 2012-01-23 ENCOUNTER — Ambulatory Visit: Payer: Self-pay | Admitting: Obstetrics and Gynecology

## 2012-02-27 ENCOUNTER — Telehealth: Payer: Self-pay | Admitting: Obstetrics and Gynecology

## 2012-02-27 NOTE — Telephone Encounter (Signed)
Left msg on pt's voice mail to call back rgd rx refill request from [facility].bt cma

## 2012-02-28 ENCOUNTER — Telehealth: Payer: Self-pay | Admitting: Obstetrics and Gynecology

## 2012-02-28 NOTE — Telephone Encounter (Signed)
Left 2nd  msg on pt's voice mail to call back rgd rx refill and pt needs to make an aex . bt cma

## 2012-02-29 ENCOUNTER — Telehealth: Payer: Self-pay | Admitting: Obstetrics and Gynecology

## 2012-02-29 NOTE — Telephone Encounter (Signed)
Spoke with pt rgd rx refill. Pt sated that she is not going to take the bc pill pt is going to try to conceive in the next year. Denied rx and faxed to [facility] disp 28 sig 1 po qd . Pt's voice understanding. bt cma

## 2012-03-15 ENCOUNTER — Encounter: Payer: Self-pay | Admitting: Obstetrics and Gynecology

## 2012-03-19 ENCOUNTER — Ambulatory Visit (INDEPENDENT_AMBULATORY_CARE_PROVIDER_SITE_OTHER): Payer: BC Managed Care – PPO | Admitting: Obstetrics and Gynecology

## 2012-03-19 ENCOUNTER — Encounter: Payer: Self-pay | Admitting: Obstetrics and Gynecology

## 2012-03-19 VITALS — BP 118/58 | Temp 98.4°F | Ht 61.5 in | Wt 278.0 lb

## 2012-03-19 DIAGNOSIS — N915 Oligomenorrhea, unspecified: Secondary | ICD-10-CM

## 2012-03-19 DIAGNOSIS — I480 Paroxysmal atrial fibrillation: Secondary | ICD-10-CM | POA: Insufficient documentation

## 2012-03-19 DIAGNOSIS — Z01419 Encounter for gynecological examination (general) (routine) without abnormal findings: Secondary | ICD-10-CM

## 2012-03-19 DIAGNOSIS — R569 Unspecified convulsions: Secondary | ICD-10-CM

## 2012-03-19 DIAGNOSIS — E8881 Metabolic syndrome: Secondary | ICD-10-CM

## 2012-03-19 DIAGNOSIS — N3090 Cystitis, unspecified without hematuria: Secondary | ICD-10-CM

## 2012-03-19 DIAGNOSIS — IMO0001 Reserved for inherently not codable concepts without codable children: Secondary | ICD-10-CM

## 2012-03-19 DIAGNOSIS — Z309 Encounter for contraceptive management, unspecified: Secondary | ICD-10-CM

## 2012-03-19 DIAGNOSIS — Q2111 Secundum atrial septal defect: Secondary | ICD-10-CM

## 2012-03-19 DIAGNOSIS — E88819 Insulin resistance, unspecified: Secondary | ICD-10-CM

## 2012-03-19 DIAGNOSIS — I4891 Unspecified atrial fibrillation: Secondary | ICD-10-CM

## 2012-03-19 DIAGNOSIS — Z8679 Personal history of other diseases of the circulatory system: Secondary | ICD-10-CM | POA: Insufficient documentation

## 2012-03-19 DIAGNOSIS — Z124 Encounter for screening for malignant neoplasm of cervix: Secondary | ICD-10-CM

## 2012-03-19 DIAGNOSIS — Q211 Atrial septal defect, unspecified: Secondary | ICD-10-CM

## 2012-03-19 DIAGNOSIS — I509 Heart failure, unspecified: Secondary | ICD-10-CM

## 2012-03-19 LAB — POCT URINE PREGNANCY: Preg Test, Ur: NEGATIVE

## 2012-03-19 NOTE — Progress Notes (Signed)
Subjective:    Joy Le is a 36 y.o. female, G1P1001, who presents for an annual exam. The patient reports no complaints but wants to know if she needs to change her medications prior to trying to conceive.  She spoke with her cardiologist @: [facility] and was advised to consult her OB-GYN.  Menstrual cycle:   LMP: Patient's last menstrual period was 02/15/2012. Menses are irregular but not heavy or very crampy            Review of Systems Pertinent items are noted in HPI. Denies pelvic pain, uti symptoms, vaginitis symptoms, irregular bleeding, menopausal symptoms, change in bowel habits or rectal bleeding   Objective:    BP 118/58  Temp(Src) 98.4 F (36.9 C) (Oral)  Ht 5' 1.5" (1.562 m)  Wt 278 lb (126.1 kg)  BMI 51.68 kg/m2  LMP 02/15/2012   Wt Readings from Last 1 Encounters:  03/19/12 278 lb (126.1 kg)   Body mass index is 51.68 kg/(m^2).  General Appearance: Alert, appropriate appearance for age. No acute distress HEENT: Grossly normal Neck / Thyroid: Supple, no thyromegaly or cervical adenopathy Lungs: clear to auscultation bilaterally Back: No CVA tenderness Breast Exam: No masses or nodes.No dimpling, nipple retraction or discharge. Cardiovascular: Regular rate and rhythm.  Gastrointestinal: Soft, non-tender, no masses or organomegaly Pelvic Exam: EGBUS-wnl, vagina-normal rugae, cervix- without lesions or tenderness, uterus appears normal size shape and consistency, though exam was limited by habitus adnexae-no masses or tenderness Lymphatic Exam: Non-palpable nodes in neck,  axillary, or inguinal regions  Skin: no rashes or abnormalities Extremities: no clubbing cyanosis or edema  Neurologic: grossly normal Psychiatric: Alert and oriented, appropriate affect.  UPT: negative   Assessment:   Routine GYN Exam Desire to conceive   Plan:    Patient to follow up with [name] to discuss changes in medications prior to conceiving  PAP  sent   Joy Le,ELMIRAPA-C

## 2012-03-19 NOTE — Patient Instructions (Signed)
Schedule appointment with [name] to discuss preconceptual medications

## 2012-03-19 NOTE — Progress Notes (Signed)
Regular Periods: no Mammogram: no  Monthly Breast Ex.: yes Exercise: yes  Tetanus < 10 years: yes Seatbelts: yes  NI. Bladder Functn.: yes Abuse at home: no  Daily BM's: yes Stressful Work: yes  Healthy Diet: yes Sigmoid-Colonoscopy: no  Calcium: no Medical problems this year: no problems   LAST PAP:10/2010  nl  Contraception: none  Mammogram:  no  PCP: dr. Alwyn Pea  PMH: no change  FMH: no change  Last Bone Scan: never

## 2012-04-09 ENCOUNTER — Encounter: Payer: BC Managed Care – PPO | Admitting: Obstetrics and Gynecology

## 2012-05-01 ENCOUNTER — Telehealth: Payer: Self-pay | Admitting: Obstetrics and Gynecology

## 2012-05-01 NOTE — Telephone Encounter (Signed)
Pt has appointment scheduled 05/07/12 to discuss changing medications before conceiving but pt recently found out she is pregnant. Pt would like to know which medications are safe to take.

## 2012-05-01 NOTE — Telephone Encounter (Signed)
Lm to call back

## 2012-05-01 NOTE — Telephone Encounter (Signed)
Triage/ob/quest.

## 2012-05-03 ENCOUNTER — Telehealth: Payer: Self-pay

## 2012-05-03 NOTE — Telephone Encounter (Signed)
Lm to pt to call back

## 2012-05-04 ENCOUNTER — Telehealth: Payer: Self-pay | Admitting: Obstetrics and Gynecology

## 2012-05-04 NOTE — Telephone Encounter (Signed)
The patient called on the phone.  No answer.  Message left that I had reviewed her medications. I recommend that she discontinue Diovan.  Continue all other medications until we have a chance to discuss at her visit on May 07, 2012.  Patient told congratulations.  [name]

## 2012-05-07 ENCOUNTER — Encounter: Payer: Self-pay | Admitting: Obstetrics and Gynecology

## 2012-05-07 ENCOUNTER — Ambulatory Visit (INDEPENDENT_AMBULATORY_CARE_PROVIDER_SITE_OTHER): Payer: BC Managed Care – PPO | Admitting: Obstetrics and Gynecology

## 2012-05-07 VITALS — BP 118/76 | Ht 61.0 in | Wt 279.0 lb

## 2012-05-07 DIAGNOSIS — Z3201 Encounter for pregnancy test, result positive: Secondary | ICD-10-CM

## 2012-05-07 DIAGNOSIS — E669 Obesity, unspecified: Secondary | ICD-10-CM

## 2012-05-07 DIAGNOSIS — N926 Irregular menstruation, unspecified: Secondary | ICD-10-CM

## 2012-05-07 DIAGNOSIS — N939 Abnormal uterine and vaginal bleeding, unspecified: Secondary | ICD-10-CM

## 2012-05-07 DIAGNOSIS — N898 Other specified noninflammatory disorders of vagina: Secondary | ICD-10-CM

## 2012-05-07 DIAGNOSIS — I4891 Unspecified atrial fibrillation: Secondary | ICD-10-CM

## 2012-05-07 LAB — POCT URINE PREGNANCY: Preg Test, Ur: NEGATIVE

## 2012-05-07 NOTE — Progress Notes (Signed)
HISTORY OF PRESENT ILLNESS  [name] is a 36 y.o. year old female,G1P1001, who presents for a problem visit. The patient has been defined to get pregnant.  She has a history of atrial fibrillation, seizure disorder, and congestive failure.  She is on multiple medications.  She had a negative pregnancy test in May.  The patient reports that she had 2 positive pregnancy test a week ago at home.  She then began having heavy bleeding on May 02, 2012.  Subjective:  No pelvic tenderness.  Objective:  BP 118/76  Ht 5\' 1"  (1.549 m)  Wt 279 lb (126.554 kg)  BMI 52.72 kg/m2  LMP 03/25/2012   GI: soft, non-tender; bowel sounds normal; no masses,  no organomegaly  External genitalia: normal general appearance Vaginal: normal mucosa without prolapse or lesions Cervix: normal appearance Adnexa: normal bimanual exam Uterus: difficult to outline because of obesity abdomen, seems normal size.  Urine pregnancy test: Negative  Assessment:  Irregular menstrual bleeding versus very early completed abortion Obesity Congestive failure Seizure disorder Atrial fibrillation  Plan:  A long discussion was had with the patient about very early miscarriages versus irregular menstrual bleeding.  She was told that at this point it is very difficult to know exactly what happened.  She does want to get pregnant again quickly.  The patient was told to take vitamins and instructions were given for extra folic acid.  She was told to reduce the stress in her life, get plenty of rest, exercise, and improve her diet.  She will speak with her cardiologist and her primary physician about her medications.  I have recommended that she talk to her doctors about stopping the Diovan.  Return to office prn if symptoms worsen or fail to improve. Annual exam in May of 2014.   Leonard Schwartz M.D.  05/07/2012 11:28 AM

## 2012-05-18 ENCOUNTER — Encounter: Payer: Self-pay | Admitting: Obstetrics and Gynecology

## 2012-11-26 ENCOUNTER — Ambulatory Visit: Payer: BC Managed Care – PPO | Admitting: Obstetrics and Gynecology

## 2012-11-26 DIAGNOSIS — Z331 Pregnant state, incidental: Secondary | ICD-10-CM

## 2012-11-26 LAB — POCT URINALYSIS DIPSTICK
Glucose, UA: NEGATIVE
Ketones, UA: NEGATIVE
Spec Grav, UA: 1.02

## 2012-11-26 NOTE — Progress Notes (Signed)
NOB interview completed.  PNV samples given.  NOB work up scheduled for Thursday 12/20/12 @ 1115 w/ AVS.

## 2012-11-27 LAB — PRENATAL PANEL VII
Basophils Absolute: 0 10*3/uL (ref 0.0–0.1)
HCT: 34.3 % — ABNORMAL LOW (ref 36.0–46.0)
HIV: NONREACTIVE
Hemoglobin: 11.3 g/dL — ABNORMAL LOW (ref 12.0–15.0)
Hepatitis B Surface Ag: NEGATIVE
Lymphocytes Relative: 37 % (ref 12–46)
Monocytes Absolute: 0.7 10*3/uL (ref 0.1–1.0)
Monocytes Relative: 9 % (ref 3–12)
Neutro Abs: 4.6 10*3/uL (ref 1.7–7.7)
Neutrophils Relative %: 53 % (ref 43–77)
Rh Type: POSITIVE
WBC: 8.6 10*3/uL (ref 4.0–10.5)

## 2012-11-27 LAB — GC/CHLAMYDIA PROBE AMP
CT Probe RNA: NEGATIVE
GC Probe RNA: NEGATIVE

## 2012-11-28 LAB — HEMOGLOBINOPATHY EVALUATION
Hgb A: 97.6 % (ref 96.8–97.8)
Hgb F Quant: 0 % (ref 0.0–2.0)
Hgb S Quant: 0 %

## 2012-12-07 ENCOUNTER — Telehealth: Payer: Self-pay | Admitting: Obstetrics and Gynecology

## 2012-12-07 ENCOUNTER — Other Ambulatory Visit: Payer: Self-pay | Admitting: Obstetrics and Gynecology

## 2012-12-07 ENCOUNTER — Ambulatory Visit: Payer: BC Managed Care – PPO | Admitting: Obstetrics and Gynecology

## 2012-12-07 ENCOUNTER — Encounter: Payer: Self-pay | Admitting: Obstetrics and Gynecology

## 2012-12-07 ENCOUNTER — Other Ambulatory Visit: Payer: BC Managed Care – PPO

## 2012-12-07 VITALS — BP 130/62 | Temp 98.5°F | Wt 277.0 lb

## 2012-12-07 DIAGNOSIS — E119 Type 2 diabetes mellitus without complications: Secondary | ICD-10-CM | POA: Insufficient documentation

## 2012-12-07 DIAGNOSIS — Z6841 Body Mass Index (BMI) 40.0 and over, adult: Secondary | ICD-10-CM

## 2012-12-07 DIAGNOSIS — IMO0002 Reserved for concepts with insufficient information to code with codable children: Secondary | ICD-10-CM | POA: Insufficient documentation

## 2012-12-07 DIAGNOSIS — O26851 Spotting complicating pregnancy, first trimester: Secondary | ICD-10-CM

## 2012-12-07 DIAGNOSIS — Z331 Pregnant state, incidental: Secondary | ICD-10-CM

## 2012-12-07 DIAGNOSIS — R319 Hematuria, unspecified: Secondary | ICD-10-CM

## 2012-12-07 DIAGNOSIS — Z98891 History of uterine scar from previous surgery: Secondary | ICD-10-CM | POA: Insufficient documentation

## 2012-12-07 DIAGNOSIS — R69 Illness, unspecified: Secondary | ICD-10-CM | POA: Insufficient documentation

## 2012-12-07 DIAGNOSIS — O09299 Supervision of pregnancy with other poor reproductive or obstetric history, unspecified trimester: Secondary | ICD-10-CM

## 2012-12-07 DIAGNOSIS — Z8632 Personal history of gestational diabetes: Secondary | ICD-10-CM

## 2012-12-07 DIAGNOSIS — I10 Essential (primary) hypertension: Secondary | ICD-10-CM | POA: Insufficient documentation

## 2012-12-07 LAB — POCT URINALYSIS DIPSTICK
Ketones, UA: NEGATIVE
Nitrite, UA: NEGATIVE
Protein, UA: 3
pH, UA: 6

## 2012-12-07 LAB — HEMOGLOBIN A1C: Mean Plasma Glucose: 163 mg/dL — ABNORMAL HIGH (ref <117)

## 2012-12-07 LAB — US OB COMP LESS 14 WKS

## 2012-12-07 NOTE — Progress Notes (Signed)
Here for evaluation of spotting this am.  No pain. On multiple meds for chronic hypertension--see problem list. Stopped Diovan per recommendation of [name]. Had probable SAB in 2013, with +UPT then onset of heavy bleeding, with negative UPT at that point. Patient Active Problem List  Diagnosis  . Resistance to insulin  . Cystitis  . Oligomenorrhea  . Atrial fibrillation  . Atrial septal defect  . Hypertension  . H/O gestational diabetes in prior pregnancy, currently pregnant  . Hx of IUGR (intrauterine growth restriction)  . Previous cesarean section due to breech  . BMI 51  . Taking multiple medications for chronic disease  Hx surgical correction of ASD as child. Hx cardioversion of atrial fib in 2012.  Correction of history--no congestive heart failure or seizure disorder.  [place] today shows viable SIUP at 8 weeks, with EDC 07/19/13, normal ovaries. EDC established by [place], due to hx of irregular cycles in past.  Pelvic--no blood in vault.  Cervix closed and long, NT. Uterus NT, slightly enlarged. GC, chlamydia negative on urine from NOB interview. Proteinuria on UA today, but was negative at NOB interview, with negative urine culture.  Plan: Keep scheduled NOB appt with [name]. Check Hgb A1C today per patient request. Needs baseline 24 hour urine and PIH labs due to chronic hypertension. Will have patient bring 24 hour urine to NOB visit, check PIH labs then, or can do before 2/13 visit if more convenient for patient. Plan early glucola unless Hgb A1C identifies patient as Type 2 diabetic. Bleeding precautions reviewed.  Nigel Bridgeman, CNM

## 2012-12-07 NOTE — Telephone Encounter (Signed)
Pt returned call, is 9 wks and states that this am when she went to BR noticed the urine was pink tinged and also on the toilet paper when she wiped.  When back to [place] an hour later and noticed the same thing.  Pt denies any pain/cramping, and has not had any recent IC, blood type O +, per VL, pt coming in today for work in [place] for viability @ 1100 and f/u w/ VL afterward, pt voices agreement.

## 2012-12-07 NOTE — Progress Notes (Signed)
[redacted]w[redacted]d Pt c/o blood in urine this am & pink d'c after wiping. Pt also c/o Sunday afternoon until Monday afternnon N, V, & diarrhea - sx's have now subsided.   Viability U/S Ultrasound: singleton pregnancy, [redacted]w[redacted]d IUP, normal ovaries, no fluid in CDS, normal adnexas

## 2012-12-07 NOTE — Telephone Encounter (Signed)
Tc to pt regarding msg, lm on vm to call back. 

## 2012-12-10 ENCOUNTER — Telehealth: Payer: Self-pay | Admitting: Obstetrics and Gynecology

## 2012-12-10 NOTE — Telephone Encounter (Signed)
Tc to pt regarding msg below.  Lm on vm to call back. 

## 2012-12-10 NOTE — Telephone Encounter (Signed)
Pt returned call regarding msg below.  Pt states will pick up 24 hour urine materials this week and start the test on Sunday and will turn in to the office on Monday am, at that time pt will have PIH labs drawn, pt voices agreement.  Pt also informed it looks as if everything ok with pregnancy, pt to continue w/ NOB work up on 12/20/12 w/ AVS.

## 2012-12-10 NOTE — Telephone Encounter (Signed)
Message copied by Delon Sacramento on Mon Dec 10, 2012 11:42 AM ------      Message from: Cornelius Moras      Created: Fri Dec 07, 2012  8:08 PM      Regarding: Needs 24 hour urine       Needs to bring 24 hour urine to NOB w/u with AVS on 12/20/12 due to chronic hypertension on meds.  Currently [redacted] weeks gestation.      Please call patient and provide supplies and instructions for collection of 24 hour urine.  Can turn in before that visit (whenever collection is most convenient for her), but will need PIH labs when it's turned in (CBC, CMP, LDH, uric acid).            Thanks!      VL

## 2012-12-11 ENCOUNTER — Encounter: Payer: Self-pay | Admitting: Obstetrics and Gynecology

## 2012-12-12 ENCOUNTER — Other Ambulatory Visit: Payer: Self-pay | Admitting: Obstetrics and Gynecology

## 2012-12-12 DIAGNOSIS — O24119 Pre-existing diabetes mellitus, type 2, in pregnancy, unspecified trimester: Secondary | ICD-10-CM

## 2012-12-17 ENCOUNTER — Other Ambulatory Visit: Payer: BC Managed Care – PPO

## 2012-12-17 DIAGNOSIS — O139 Gestational [pregnancy-induced] hypertension without significant proteinuria, unspecified trimester: Secondary | ICD-10-CM

## 2012-12-17 LAB — CREATININE, URINE, 24 HOUR: Creatinine, 24H Ur: 1502 mg/d (ref 700–1800)

## 2012-12-17 LAB — CBC
HCT: 33.2 % — ABNORMAL LOW (ref 36.0–46.0)
Platelets: 296 10*3/uL (ref 150–400)
RDW: 14 % (ref 11.5–15.5)
WBC: 6.6 10*3/uL (ref 4.0–10.5)

## 2012-12-17 LAB — PROTEIN, URINE, 24 HOUR: Protein, 24H Urine: 90 mg/d (ref 50–100)

## 2012-12-17 LAB — COMPREHENSIVE METABOLIC PANEL
ALT: 19 U/L (ref 0–35)
CO2: 24 mEq/L (ref 19–32)
Calcium: 9.2 mg/dL (ref 8.4–10.5)
Chloride: 105 mEq/L (ref 96–112)
Creat: 0.5 mg/dL (ref 0.50–1.10)
Glucose, Bld: 114 mg/dL — ABNORMAL HIGH (ref 70–99)

## 2012-12-19 LAB — LACTATE DEHYDROGENASE, ISOENZYMES
LDH 1: 26 % (ref 19–38)
LDH 4: 8 % (ref 3–12)
LDH Isoenzymes, Total: 124 U/L (ref 100–200)

## 2012-12-20 ENCOUNTER — Encounter: Payer: BC Managed Care – PPO | Admitting: Obstetrics and Gynecology

## 2012-12-26 ENCOUNTER — Telehealth: Payer: Self-pay | Admitting: Obstetrics and Gynecology

## 2012-12-26 ENCOUNTER — Ambulatory Visit: Payer: BC Managed Care – PPO

## 2012-12-26 ENCOUNTER — Ambulatory Visit: Payer: BC Managed Care – PPO | Admitting: Certified Nurse Midwife

## 2012-12-26 ENCOUNTER — Encounter: Payer: Self-pay | Admitting: Certified Nurse Midwife

## 2012-12-26 VITALS — BP 130/60 | Temp 99.3°F | Wt 276.0 lb

## 2012-12-26 LAB — POCT URINALYSIS DIPSTICK
Blood, UA: >3
Spec Grav, UA: 1.02

## 2012-12-26 LAB — US OB LIMITED

## 2012-12-26 MED ORDER — NITROFURANTOIN MONOHYD MACRO 100 MG PO CAPS: 100.0000 mg | ORAL_CAPSULE | Freq: Two times a day (BID) | ORAL | Status: AC

## 2012-12-26 NOTE — Telephone Encounter (Signed)
Pt called, is 10w 5d, states when she went to the bathroom this am noticed a significant amount of blood in the toilet and also so a light pink d/c on the tissue.  Pt denies any pain or cramping today, but did experience some last pm. Also noticed some pressure when she urinated.  Pt scheduled an eval today w/ CA @ 1015.

## 2012-12-26 NOTE — Progress Notes (Signed)
[redacted]w[redacted]d C/o blood in urine since this am

## 2012-12-26 NOTE — Progress Notes (Signed)
Pt. Here with c/o dark colored urine with urinary frequency. ? Spotting. Denies fever, N or V. Denies pain. Taking meds as directed. FBS "108" this am. Missed appt. With [name] re: DM. Appetite decreased. Fluid intake "needs improvement".  PE-Alert and oriented  Heart-RRR without Murmur  Lungs - C to A  NO CVAT  Abd: morbid obesity, non tender. Unable to auscultate FHT's with doppler.  Speculum- nl appearing d/c, no visible bleeding, Nl ext. Genitalia  Uterus- Enlarged Non Tender, no CMT Ass: Hx. Of gestational DM on insulin  Recent abn. A1C Plan: Balanced ADA diet briefly reviewed  Increase fluids, esp. H20  Document blood sugars  Cont. meds as directed  US done to assure FHT's-WNL  Re: schedule appt. With [name]  Macrobid 1 po bid x 7 days  Pt. To call if unable to tolerate food or fluids or if pain or fever occurs   Joy Le, CNM, FNP

## 2012-12-29 LAB — URINE CULTURE: Colony Count: >=100000

## 2013-01-03 ENCOUNTER — Ambulatory Visit: Payer: BC Managed Care – PPO | Admitting: Obstetrics and Gynecology

## 2013-01-03 ENCOUNTER — Encounter: Payer: Self-pay | Admitting: Obstetrics and Gynecology

## 2013-01-03 VITALS — BP 126/60 | Wt 275.0 lb

## 2013-01-03 DIAGNOSIS — E119 Type 2 diabetes mellitus without complications: Secondary | ICD-10-CM

## 2013-01-03 MED ORDER — METFORMIN HCL 500 MG PO TABS: 500.0000 mg | ORAL_TABLET | Freq: Three times a day (TID) | ORAL | Status: DC

## 2013-01-03 NOTE — Progress Notes (Signed)
[redacted]w[redacted]d Pt fels much better than last visit

## 2013-01-03 NOTE — Progress Notes (Signed)
[redacted]w[redacted]d The patient presents for a followup visit. She still has not had her new OB exam. We will schedule. Fasting blood sugars are between 108 and 124. Two-hour p.c. blood sugars are normal. She is currently taking metformin 500 mg twice each day. Management of diabetes was reviewed. The patient does not want to start insulin at this time. I told her to increase her metformin to 500 mg 3 times each day. She was encouraged to strictly follow her diet and to exercise on a daily basis. She is referred to the diabetes Center to review nutrition. The patient will return to the office in one week. Her blood sugars have not improved then I believe insulin will be our best option. The patient declines genetic studies. [name]

## 2013-01-21 ENCOUNTER — Encounter: Payer: BC Managed Care – PPO | Admitting: Family Medicine

## 2013-01-24 ENCOUNTER — Other Ambulatory Visit: Payer: Self-pay | Admitting: Obstetrics and Gynecology

## 2013-01-24 ENCOUNTER — Encounter: Payer: BC Managed Care – PPO | Admitting: Family Medicine

## 2013-01-25 ENCOUNTER — Other Ambulatory Visit: Payer: Self-pay | Admitting: Family Medicine

## 2013-01-28 ENCOUNTER — Telehealth: Payer: Self-pay | Admitting: Family Medicine

## 2013-01-28 LAB — PAP IG, CT-NG, RFX HPV ASCU
Chlamydia Probe Amp: NEGATIVE
GC Probe Amp: NEGATIVE

## 2013-01-28 NOTE — Telephone Encounter (Signed)
Pt wanted to know why she needs to see endocrinologist.  Discussed HgA1c, hx of IGF.  She voiced understanding.  L.Keianna Signer, FNP-BC

## 2013-02-27 ENCOUNTER — Encounter: Payer: BC Managed Care – PPO | Attending: Obstetrics and Gynecology | Admitting: *Deleted

## 2013-02-27 VITALS — Ht 61.5 in | Wt 275.9 lb

## 2013-02-27 DIAGNOSIS — Z713 Dietary counseling and surveillance: Secondary | ICD-10-CM | POA: Insufficient documentation

## 2013-02-27 DIAGNOSIS — O24113 Pre-existing diabetes mellitus, type 2, in pregnancy, third trimester: Secondary | ICD-10-CM

## 2013-02-27 DIAGNOSIS — O99810 Abnormal glucose complicating pregnancy: Secondary | ICD-10-CM | POA: Insufficient documentation

## 2013-03-07 ENCOUNTER — Encounter: Payer: Self-pay | Admitting: *Deleted

## 2013-03-07 NOTE — Patient Instructions (Signed)
Goals:  Check glucose levels per MD as instructed  Follow Gestational Diabetes Diet as instructed  Take insulin as Rx'd  Call for follow-up as needed

## 2013-03-07 NOTE — Progress Notes (Signed)
  Patient was seen on 02/26/2013 for Type 2 Diabetes with pregnancy self-management visit at the [facility]. The following learning objectives were met by the patient during this appointment:   States the definition of Gestational Diabetes  States why dietary management is important in controlling blood glucose  Describes the effects each nutrient has on blood glucose levels  Demonstrates ability to create a balanced meal plan  Demonstrates carbohydrate counting   States when to check blood glucose levels  Demonstrates proper blood glucose monitoring techniques  States the effect of stress and exercise on blood glucose levels  States the importance of limiting caffeine and abstaining from alcohol and smoking  Insulin Instruction  Insulin Action of NPH and Regular insulins  Reviewed syringe & vial VS pen including # units per syringe,    length of needles, vial VS Pen cartridge and needles  Hygiene and storage  Drawing up single and mixed doses if using vials   Single dose   Mixed dose:   Rotation of Sites  Hypoglycemia- symptoms, causes , treatment choices  Record keeping and MD follow up  Hypoglycemia, causes, symptoms and treatment Patient demonstrated understanding of insulin administration by return demonstration.  Patient to start on insulin as Rx'd by MD   Blood glucose monitor given: Blood glucose monitor given: Contour Next EZ Blood Glucose Kit Lot # [id number] Exp: 10/2013 Blood glucose reading: 139  Patient instructed to monitor glucose levels: FBS: 60 - <90 2 hour: <120   Patient received the following handouts:  Insulin Instruction Handout  Mixing Insulin Brochure by BD Getting Started  BD Getting Started Kit  Nutrition Diabetes and Pregnancy  Carbohydrate Counting List                                         Patient will be seen for follow-up as needed.

## 2013-03-13 ENCOUNTER — Ambulatory Visit (HOSPITAL_COMMUNITY)
Admission: RE | Admit: 2013-03-13 | Discharge: 2013-03-13 | Disposition: A | Discharge status: Home / Self Care | Payer: BC Managed Care – PPO | Source: Ambulatory Visit | Attending: Obstetrics and Gynecology | Admitting: Obstetrics and Gynecology

## 2013-03-13 ENCOUNTER — Other Ambulatory Visit: Payer: Self-pay

## 2013-03-13 ENCOUNTER — Other Ambulatory Visit: Payer: Self-pay | Admitting: Obstetrics and Gynecology

## 2013-03-13 DIAGNOSIS — O2390 Unspecified genitourinary tract infection in pregnancy, unspecified trimester: Secondary | ICD-10-CM | POA: Insufficient documentation

## 2013-03-13 DIAGNOSIS — N200 Calculus of kidney: Secondary | ICD-10-CM

## 2013-03-13 DIAGNOSIS — R319 Hematuria, unspecified: Secondary | ICD-10-CM | POA: Insufficient documentation

## 2013-03-13 DIAGNOSIS — N390 Urinary tract infection, site not specified: Secondary | ICD-10-CM | POA: Insufficient documentation

## 2013-03-18 ENCOUNTER — Other Ambulatory Visit: Payer: Self-pay | Admitting: Cardiovascular Disease

## 2013-03-22 ENCOUNTER — Telehealth: Payer: Self-pay | Admitting: Cardiovascular Disease

## 2013-03-22 MED ORDER — RIVAROXABAN 20 MG PO TABS: 20.0000 mg | ORAL_TABLET | Freq: Every day | ORAL | Status: DC

## 2013-03-22 NOTE — Telephone Encounter (Signed)
Need refill on her Xaltero 20mg #90-Call to [facility] on [address]-Please call today-completely out of it!

## 2013-03-26 ENCOUNTER — Other Ambulatory Visit: Payer: Self-pay | Admitting: Obstetrics and Gynecology

## 2013-03-26 ENCOUNTER — Other Ambulatory Visit: Payer: Self-pay

## 2013-03-27 ENCOUNTER — Other Ambulatory Visit: Payer: Self-pay | Admitting: Obstetrics and Gynecology

## 2013-03-27 ENCOUNTER — Telehealth: Payer: Self-pay | Admitting: Obstetrics and Gynecology

## 2013-03-27 DIAGNOSIS — I4891 Unspecified atrial fibrillation: Secondary | ICD-10-CM

## 2013-03-27 DIAGNOSIS — O169 Unspecified maternal hypertension, unspecified trimester: Secondary | ICD-10-CM

## 2013-03-27 DIAGNOSIS — O24319 Unspecified pre-existing diabetes mellitus in pregnancy, unspecified trimester: Secondary | ICD-10-CM

## 2013-03-27 DIAGNOSIS — R319 Hematuria, unspecified: Secondary | ICD-10-CM

## 2013-03-29 ENCOUNTER — Ambulatory Visit (HOSPITAL_COMMUNITY)
Admission: RE | Admit: 2013-03-29 | Discharge: 2013-03-29 | Disposition: A | Discharge status: Home / Self Care | Payer: BC Managed Care – PPO | Source: Ambulatory Visit | Attending: Obstetrics and Gynecology | Admitting: Obstetrics and Gynecology

## 2013-03-29 VITALS — BP 136/76 | HR 86 | Wt 276.0 lb

## 2013-03-29 DIAGNOSIS — O24112 Pre-existing diabetes mellitus, type 2, in pregnancy, second trimester: Secondary | ICD-10-CM

## 2013-03-29 DIAGNOSIS — IMO0002 Reserved for concepts with insufficient information to code with codable children: Secondary | ICD-10-CM

## 2013-03-29 DIAGNOSIS — E669 Obesity, unspecified: Secondary | ICD-10-CM | POA: Insufficient documentation

## 2013-03-29 DIAGNOSIS — I10 Essential (primary) hypertension: Secondary | ICD-10-CM

## 2013-03-29 DIAGNOSIS — O09529 Supervision of elderly multigravida, unspecified trimester: Secondary | ICD-10-CM | POA: Insufficient documentation

## 2013-03-29 DIAGNOSIS — R319 Hematuria, unspecified: Secondary | ICD-10-CM

## 2013-03-29 DIAGNOSIS — O169 Unspecified maternal hypertension, unspecified trimester: Secondary | ICD-10-CM

## 2013-03-29 DIAGNOSIS — O358XX0 Maternal care for other (suspected) fetal abnormality and damage, not applicable or unspecified: Secondary | ICD-10-CM | POA: Insufficient documentation

## 2013-03-29 DIAGNOSIS — O34219 Maternal care for unspecified type scar from previous cesarean delivery: Secondary | ICD-10-CM | POA: Insufficient documentation

## 2013-03-29 DIAGNOSIS — O24319 Unspecified pre-existing diabetes mellitus in pregnancy, unspecified trimester: Secondary | ICD-10-CM

## 2013-03-29 DIAGNOSIS — R69 Illness, unspecified: Secondary | ICD-10-CM

## 2013-03-29 DIAGNOSIS — O09299 Supervision of pregnancy with other poor reproductive or obstetric history, unspecified trimester: Secondary | ICD-10-CM | POA: Insufficient documentation

## 2013-03-29 DIAGNOSIS — O24919 Unspecified diabetes mellitus in pregnancy, unspecified trimester: Secondary | ICD-10-CM | POA: Insufficient documentation

## 2013-03-29 DIAGNOSIS — I4891 Unspecified atrial fibrillation: Secondary | ICD-10-CM

## 2013-03-29 DIAGNOSIS — E119 Type 2 diabetes mellitus without complications: Secondary | ICD-10-CM

## 2013-03-29 DIAGNOSIS — Z1389 Encounter for screening for other disorder: Secondary | ICD-10-CM | POA: Insufficient documentation

## 2013-03-29 DIAGNOSIS — O352XX0 Maternal care for (suspected) hereditary disease in fetus, not applicable or unspecified: Secondary | ICD-10-CM | POA: Insufficient documentation

## 2013-03-29 DIAGNOSIS — O10019 Pre-existing essential hypertension complicating pregnancy, unspecified trimester: Secondary | ICD-10-CM | POA: Insufficient documentation

## 2013-03-29 DIAGNOSIS — O10912 Unspecified pre-existing hypertension complicating pregnancy, second trimester: Secondary | ICD-10-CM

## 2013-03-29 DIAGNOSIS — Z363 Encounter for antenatal screening for malformations: Secondary | ICD-10-CM | POA: Insufficient documentation

## 2013-03-29 DIAGNOSIS — Z98891 History of uterine scar from previous surgery: Secondary | ICD-10-CM

## 2013-03-29 DIAGNOSIS — Z6841 Body Mass Index (BMI) 40.0 and over, adult: Secondary | ICD-10-CM

## 2013-03-29 NOTE — Telephone Encounter (Signed)
Please call asap! They want changer her medicine to Lopressor- she is pregnant and her OB doctor is the one who wants to change it!

## 2013-03-29 NOTE — Addendum Note (Signed)
Encounter addended by: Eulis Foster, MD on: 03/29/2013  2:04 PM<BR>     Documentation filed: Episodes, Clinical Notes

## 2013-03-29 NOTE — Progress Notes (Signed)
[facility] ultrasound  Indication: 37 yr old G2P1001 at [redacted]w[redacted]d with chronic hypertension, type II diabetes, atrial fibrillation, obesity, history of atrial septal defect,  and previous child with fetal growth restriction for fetal ultrasound.  Findings: 1. Single intrauterine pregnancy. 2. Estimated fetal weight is in the 61st%. 3. Anterior placenta without evidence of previa. 4. Normal amniotic fluid volume. 5. Normal transabdominal cervical length. 6. The anatomic survey is extremely limited by advanced gestational age and maternal body habitus. No abnormalities were seen.  Recommendations: 1. Appropriate fetal growth. 2. Limited anatomy survey: - recommend follow up in 4 weeks; will reattempt to complete anatomy 3. Type II diabetes: - see consult letter 4. Chronic hypertension: - see consult leetter 5. Atrial fibrillation: - see consult letter 6. History of atrial septal defect: - there is an increased risk of 3-4% of fetus having congenital heart defect - recommend fetal echocardiogram; scheduled 7. Previous child with fetal growth restriction: - recommend follow fetal growth every 4 weeks 8. Advanced maternal age: - not discussed today - offer aneuploidy with cell free fetal DNA if patient desires  Eulis Foster, MD

## 2013-03-29 NOTE — Consult Note (Addendum)
MFM consult  37 yr old G2P1001 at [redacted]w[redacted]d with chronic hypertension, type II diabetes, atrial fibrillation, obesity, history of atrial septal defect,  and previous child with fetal growth restriction referred by [name] for fetal ultrasound and consult.  Past OB hx: full term C section for breech; complicated by gestational diabetes and fetal growth restriction  PMH: Chronic hypertension- on diltiazem Type II diabetes: on metformin; reports elevated fasting blood sugars; normal postprandial sugars Atrial fibrillation: no recent episodes; no metoprolol and xarelto Hemturia: followed by Urology; unknown etiology; renal ultrasound and urine culture negative History of ASD: repaired as a child  Ultrasound today shows: estimated fetal weight is in the 61st%. Anterior placenta without evidence of previa. Normal amniotic fluid volume. Normal transabdominal cervical length. The anatomic survey is extremely limited by advanced gestational age and maternal body habitus. No abnormalities were seen.  I counseled the patient as follows:  1. Appropriate fetal growth. 2. Limited anatomy survey: - recommend follow up in 4 weeks; will reattempt to complete anatomy 3. Type II diabetes: Discussed increased risks in pregnancy include: fetal macrosomia, shoulder dystocia, and increased risk of requiring a Cesarean delivery. There is also an increased risk of developing preeclampsia during the pregnancy. There is an increased risk of congenital malformations related to level of diabetic control in the first trimester. I discussed there is an increased risk of stillbirth, neonatal hypoglycemia, neonatal jaundice, and neonatal electrolyte disturbances. I recommend strict glucose control maintaining fasting blood sugars <90 and 2 hour postprandial values <120. Patient is followed by Endocrinology. Recommend if continues to have elevated fasting blood sugars to either increase metformin at night or start NPH insulin  at bedtime.  I recommend starting fetal kick counts- at [redacted] weeks gestation. I recommend starting antenatal testing with either weekly biophysical profiles or twice weekly nonstress tests and weekly amniotic fluid index starting at 32 weeks.  I recommend following fetal growth every 4 weeks. I recommend delivery by estimated due date but not prior to 39 weeks in the absence of other complications. I recommend obtain baseline TSH, CBC, AST, ALT, BUN, creatinine, uric acid, 24 hour urine total protein, and EKG. The patient should have an Ophthamology exam if not recently performed. Recommend check hemoglobin A1C every trimester. Recent value was 6.4%.  Given the increased risk of congenital anomalies recommend fetal echocardiogram as soon as possible- the referral was made. The anatomy survey is limited on today's exam- recommend follow up in 4 weeks to complete anatomy. 4. Chronic hypertension: I discussed that women with preexistent hypertension are at increased risk of adverse pregnancy outcome, including preeclampsia, abruption, fetal growth restriction, and perinatal death. The risk increases with severity of hypertension and presence of end-organ damage. Risk of superimposed preeclampsia is 10 to 25 %; risk of abruption is 0.7 to 1.5 %; and risk of fetal growth restriction is 8 to 16 %. I also discussed that risk of preterm delivery is increased and is usually iatrogenic from the complications listed above. Risk of preterm delivery in women with chronic hypertension is 12-34%. There is also an increased risk of requiring C section for the above reasons. Patient' hypertension is currently managed with diltiazem and metoprolol- these choices are likely in light of atrial fibrillation and may be continued in pregnancy. Both are category C medications. Recommend follow fetal growth as below. I would recommend targeting therapy to keep systolic blood pressures <150 and diastolic blood pressures <100. I  recommend obtaining baseline studies: EKG, 24 hour urine for  total protein, CBC, AST, ALT, BUN, creatinine, and uric acid. I recommend serial ultrasounds for fetal growth every 4 weeks starting at [redacted] weeks gestation. I recommend close surveillance for the development of signs/symptoms of preeclampsia especially in the third trimester. I recommend antenatal testing to start at [redacted] weeks gestation. Recommend delivery by estimated due date but not prior to 38 weeks in the absence of other complications. 5. Atrial fibrillation: - patient is managed by Cardiology; however it seems her Cardiologist is not aware she is currently pregnant - managed on metoprolol and diltiazem which may be continued in pregnancy- see above - it was recommended to the patient that she be anticoagulated and is currently on xarelto - discussed this is a category C medication and there is insufficient data to evaluate its safety in pregnancy and the recommendation is to not use it in pregnancy especially given that the anticoagulant effect cannot be easily monitored or readily reversed there may be an increased risk of bleeding or subtherapeutic treatment - I recommend the patient be switched to lovenox or heparin; discussed risk of severe bleeding is low <3% - if full anticoagulation is indicated recommend lovenox 1mg /kg bid and target anti Xa levels to 0.5-1; if heparin is used target PTT to therapeutic range - patient reports desires repeat C section; recommend schedule at 39 weeks and hold lovenox 24 hours prior to C section and restart 12 hours after C section - discussed warfarin is contraindicated in pregnancy under most circumstances - patient reports no recent episodes of atrial fibrillation - discussed if atrial fibrillation occurs cardioversion can be used if patient is hemodynamically unstable; otherwise medical management may be used - recommend telemetry during delivery and up to 24 hours postpartum to monitor  rhythm - patient to call with symptoms 6. History of atrial septal defect: - there is an increased risk of 3-4% of fetus having congenital heart defect - recommend fetal echocardiogram; scheduled 7. Previous child with fetal growth restriction: - recommend follow fetal growth every 4 weeks 8. Advanced maternal age: - not discussed today - offer aneuploidy with cell free fetal DNA if patient desires 9. Hematuria: - has seen Urology - no clear etiology - no evidence of renal stone or infection - would recommend follow up with Urology postpartum   I spent 60 minutes in face to face consultation with the patient in addition to time spent on the ultrasound.  Eulis Foster, MD

## 2013-04-03 ENCOUNTER — Encounter: Payer: Self-pay | Admitting: Obstetrics and Gynecology

## 2013-04-10 ENCOUNTER — Ambulatory Visit: Payer: BC Managed Care – PPO | Admitting: Physician Assistant

## 2013-04-18 ENCOUNTER — Ambulatory Visit (INDEPENDENT_AMBULATORY_CARE_PROVIDER_SITE_OTHER): Payer: BC Managed Care – PPO | Admitting: Cardiovascular Disease

## 2013-04-18 ENCOUNTER — Encounter: Payer: Self-pay | Admitting: Cardiovascular Disease

## 2013-04-18 ENCOUNTER — Encounter: Payer: Self-pay | Admitting: *Deleted

## 2013-04-18 VITALS — BP 126/68 | HR 82 | Ht 61.5 in | Wt 271.0 lb

## 2013-04-18 DIAGNOSIS — I4892 Unspecified atrial flutter: Secondary | ICD-10-CM

## 2013-04-18 DIAGNOSIS — I10 Essential (primary) hypertension: Secondary | ICD-10-CM

## 2013-04-18 DIAGNOSIS — IMO0002 Reserved for concepts with insufficient information to code with codable children: Secondary | ICD-10-CM

## 2013-04-18 DIAGNOSIS — Z79899 Other long term (current) drug therapy: Secondary | ICD-10-CM

## 2013-04-18 DIAGNOSIS — Q211 Atrial septal defect: Secondary | ICD-10-CM

## 2013-04-18 DIAGNOSIS — Q233 Congenital mitral insufficiency: Secondary | ICD-10-CM

## 2013-04-18 DIAGNOSIS — O24919 Unspecified diabetes mellitus in pregnancy, unspecified trimester: Secondary | ICD-10-CM

## 2013-04-18 DIAGNOSIS — I4891 Unspecified atrial fibrillation: Secondary | ICD-10-CM

## 2013-04-18 DIAGNOSIS — O24113 Pre-existing diabetes mellitus, type 2, in pregnancy, third trimester: Secondary | ICD-10-CM

## 2013-04-18 MED ORDER — ENOXAPARIN SODIUM 120 MG/0.8ML ~~LOC~~ SOLN: 120.0000 mg | Freq: Two times a day (BID) | SUBCUTANEOUS | Status: DC

## 2013-04-18 MED ORDER — LABETALOL HCL 100 MG PO TABS: 50.0000 mg | ORAL_TABLET | Freq: Two times a day (BID) | ORAL | Status: DC

## 2013-04-18 NOTE — Patient Instructions (Signed)
Your physician recommends that you schedule a follow-up appointment in: 3 months. Your physician has recommended you make the following change in your medication: Stop metoprolol; start labetalol 50 mg twice a day  Stop Xarelto. The following day start Lovenox (enoxaparin) 120 mg (a whole syringe) every 12 hours. Inject into a fold of skin. Do not injected deep to the level of the muscle.  Check laboratory tests in 2 weeks.

## 2013-04-18 NOTE — Assessment & Plan Note (Signed)
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 There is no evidence of residual ASD but she does have mild to moderate mitral insufficiency. She has preserved left ventricular systolic size and function. By echocardiography the left atrium and right atrium were both described as being normal in size. She does not have signs symptoms or echo evidence of significant pulmonary hypertension. She has tolerated second trimester pregnancy without any symptoms.

## 2013-04-18 NOTE — Assessment & Plan Note (Addendum)
No clinically evident recurrence recently. She has been on anti-coagulation therapy with Xarelto. As we approach pregnancy term this medication becomes a liability. We discussed self-administered twice daily enoxaparin as alternative anti-coagulation. We discussed the way to perform the transition from one drug to another. Have asked her not to stop the current medication until we make sure that her insurance will provide coverage for the enoxaparin. If this medication will be very expensive for her I think we will delay to switch until pregnancy with 33-34 to minimize the total duration and cost of therapy. Obviously there is no clinical trial based experience with any anticoagulant in this situation, but there is reasonably good reported data regarding the safety and efficacy of enoxaparin. Check a CBC every 2 weeks or so.

## 2013-04-18 NOTE — Assessment & Plan Note (Signed)
Is there is no prosthetic valve involved, by current guidelines she does not require antibiotic prophylaxis for dental procedures. I would recommend antibiotic prophylaxis for gynecological nonsterile procedures. This is asymptomatic. At one point the mitral insufficiency was estimated to be moderate to severe but by her most recent echo performed in May of 2012 of those only mild to moderate mitral insufficiency.

## 2013-04-18 NOTE — Assessment & Plan Note (Signed)
The current fetus  appears to have normal development of an estimated weight. Will discontinue the metoprolol and switch to low-dose labetalol for blood pressure control. This agents will provide less rate control should she have breakthrough atrial fibrillation but will be safer from a pregnancy standpoint

## 2013-04-19 ENCOUNTER — Encounter: Payer: Self-pay | Admitting: Cardiovascular Disease

## 2013-04-19 NOTE — Progress Notes (Signed)
Patient ID: GENIECE AKERS, female   DOB: [birth year], 37 y.o.   MRN: [id number]     Reason for office visit Atrial fibrillation, Mitral insufficiency, history of ostium primum ASD repair    Allergies  Allergen Reactions  . Codeine Other (See Comments)    Hallucination    Current Outpatient Prescriptions  Medication Sig Dispense Refill  . CINNAMON PO Take by mouth.      . cyanocobalamin 100 MCG tablet Take 100 mcg by mouth daily.      Marland Kitchen diltiazem (CARDIZEM CD) 120 MG 24 hr capsule Take 120 mg by mouth daily.      . metFORMIN (GLUCOPHAGE) 500 MG tablet Take 500 mg by mouth 4 (four) times daily.       . Multiple Vitamin (MULITIVITAMIN WITH MINERALS) TABS Take 1 tablet by mouth daily.      Marland Kitchen enoxaparin (LOVENOX) 120 MG/0.8ML injection Inject 0.8 mLs (120 mg total) into the skin every 12 (twelve) hours.  60 Syringe  3  . labetalol (NORMODYNE) 100 MG tablet Take 0.5 tablets (50 mg total) by mouth 2 (two) times daily.  30 tablet  11   No current facility-administered medications for this visit.    Past Medical History  Diagnosis Date  . Resistance to insulin   . ASD (atrial septal defect)     ASD repair @ age 40 years old  . Irregular menstrual cycle   . Cystitis   . Diabetes mellitus     Type I GDM  . Hypertension     Chronic HTN  . H/O: obesity   . H/O menorrhagia 04/28/04  . Oligomenorrhea 04/28/04  . Irregular menses 10/27/06  . Atrial flutter     x 2  . Congenital heart defect     Atrial ventricular defect  . Asthma     No longer has  . Gestational diabetes 2008  . Cleft leaflet, mitral valve     anterior  . Systemic hypertension     Past Surgical History  Procedure Laterality Date  . Wisdom tooth extraction    . Cesarean section    . Asd repair  1984    [facility]  . Cardioversion  01/25/2011    successful  . Cardiac catheterization  04/27/2005    No sign. CAD,mod. MR,elevated LV end-diastolic pressure,EF 50% w/mild anterolateral hypokinesis  . US  echocardiography  03/29/2011    mild to mod MR, mild TR,EF 60-65%    Family History  Problem Relation Age of Onset  . Diabetes Mother   . Stroke Mother   . Hypertension Mother   . Hypertension Father   . Anemia Sister   . Diabetes Maternal Aunt   . Diabetes Paternal Aunt   . Diabetes Maternal Grandmother   . Lung cancer Paternal Grandfather   . Alcohol abuse Paternal Grandfather   . Alzheimer's disease Maternal Grandfather   . Hypertension Sister     History   Social History  . Marital Status: Married    Spouse Name: Hermelinda Diegel "BJ"    Number of Children: 1  . Years of Education: 20   Occupational History  . Teacher [employer]   Social History Main Topics  . Smoking status: Never Smoker   . Smokeless tobacco: Never Used  . Alcohol Use: Yes     Comment: occasionally prior to pregnancy  . Drug Use: No  . Sexually Active: Yes -- Female partner(s)    Birth Control/ Protection: None   Other Topics  Concern  . Not on file   Social History Narrative  . No narrative on file    Review of systems: The patient specifically denies any chest pain at rest exertion, dyspnea at rest or with exertion, orthopnea, paroxysmal nocturnal dyspnea, syncope, palpitations, focal neurological deficits, intermittent claudication, lower extremity edema, unexplained weight gain, cough, hemoptysis or wheezing.   PHYSICAL EXAM BP 126/68  Pulse 82  Ht 5' 1.5" (1.562 m)  Wt 271 lb (122.925 kg)  BMI 50.38 kg/m2  LMP 10/05/2012  General: Alert, oriented x3, no distress; pregnancy Head: no evidence of trauma, PERRL, EOMI, no exophtalmos or lid lag, no myxedema, no xanthelasma; normal ears, nose and oropharynx Neck: normal jugular venous pulsations and no hepatojugular reflux; brisk carotid pulses without delay and no carotid bruits Chest: clear to auscultation, no signs of consolidation by percussion or palpation, normal fremitus, symmetrical and full respiratory  excursions Cardiovascular: normal position and quality of the apical impulse, regular rhythm, normal first and second heart sounds, 1/6 holosystolic apical murmur, no rumble, rubs or gallops Abdomen: no tenderness or distention, no masses by palpation, no abnormal pulsatility or arterial bruits, normal bowel sounds, no hepatosplenomegaly Extremities: no clubbing, cyanosis or edema; 2+ radial, ulnar and brachial pulses bilaterally; 2+ right femoral, posterior tibial and dorsalis pedis pulses; 2+ left femoral, posterior tibial and dorsalis pedis pulses; no subclavian or femoral bruits Neurological: grossly nonfocal   EKG: sinus rhythm, first degree AV block, left axis deviation  Lipid Panel  No results found for this basename: chol, trig, hdl, cholhdl, vldl, ldlcalc    BMET    Component Value Date/Time   NA 138 12/17/2012 0858   K 4.4 12/17/2012 0858   CL 105 12/17/2012 0858   CO2 24 12/17/2012 0858   GLUCOSE 114* 12/17/2012 0858   BUN 8 12/17/2012 0858   CREATININE 0.50 12/17/2012 0858   CREATININE 0.70 08/13/2011 2211   CALCIUM 9.2 12/17/2012 0858   GFRNONAA >90 08/13/2011 2211   GFRAA >90 08/13/2011 2211     ASSESSMENT AND PLAN Atrial fibrillation No clinically evident recurrence recently. She has been on anti-coagulation therapy with Xarelto. As we approach pregnancy term this medication becomes a liability. We discussed self-administered twice daily enoxaparin as alternative anti-coagulation. We discussed the way to perform the transition from one drug to another. Have asked her not to stop the current medication until we make sure that her insurance will provide coverage for the enoxaparin. If this medication will be very expensive for her I think we will delay to switch until pregnancy with 33-34 to minimize the total duration and cost of therapy. Obviously there is no clinical trial based experience with any anticoagulant in this situation, but there is reasonably good reported data  regarding the safety and efficacy of enoxaparin. Check a CBC every 2 weeks or so.   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 There is no evidence of residual ASD but she does have mild to moderate mitral insufficiency. She has preserved left ventricular systolic size and function. By echocardiography the left atrium and right atrium were both described as being normal in size. She does not have signs symptoms or echo evidence of significant pulmonary hypertension. She has tolerated second trimester pregnancy without any symptoms.  Mitral valve insufficiency, congenital Is there is no prosthetic valve involved, by current guidelines she does not require antibiotic prophylaxis for dental procedures. I would recommend antibiotic prophylaxis  for gynecological nonsterile procedures. This is asymptomatic. At one point the mitral insufficiency was estimated to be moderate to severe but by her most recent echo performed in May of 2012 of those only mild to moderate mitral insufficiency.  Hx of IUGR (intrauterine growth restriction) The current fetus  appears to have normal development of an estimated weight. Will discontinue the metoprolol and switch to low-dose labetalol for blood pressure control. This agents will provide less rate control should she have breakthrough atrial fibrillation but will be safer from a pregnancy standpoint  Type 2 diabetes mellitus complicating pregnancy, antepartum Currently on Metformin 500 mg po BID, smoother control than during previous pregnancy.  Orders Placed This Encounter  Procedures  . CBC  . EKG 12-Lead   Meds ordered this encounter  Medications  . diltiazem (CARDIZEM CD) 120 MG 24 hr capsule    Sig: Take 120 mg by mouth daily.  Marland Kitchen DISCONTD: metoprolol succinate (TOPROL-XL) 50 MG 24 hr tablet    Sig: Take 50 mg by mouth daily.  Marland Kitchen labetalol (NORMODYNE) 100 MG tablet    Sig:  Take 0.5 tablets (50 mg total) by mouth 2 (two) times daily.    Dispense:  30 tablet    Refill:  11  . enoxaparin (LOVENOX) 120 MG/0.8ML injection    Sig: Inject 0.8 mLs (120 mg total) into the skin every 12 (twelve) hours.    Dispense:  60 Syringe    Refill:  3  . DISCONTD: Carlena Hurl 20 MG TABS    Sig:     Junious Silk, MD, [facility] [phone] office [phone] pager

## 2013-04-19 NOTE — Assessment & Plan Note (Signed)
Currently on Metformin 500 mg po BID, smoother control than during previous pregnancy.

## 2013-04-26 ENCOUNTER — Ambulatory Visit (HOSPITAL_COMMUNITY)
Admission: RE | Admit: 2013-04-26 | Discharge: 2013-04-26 | Disposition: A | Discharge status: Home / Self Care | Payer: BC Managed Care – PPO | Source: Ambulatory Visit | Attending: Obstetrics and Gynecology | Admitting: Obstetrics and Gynecology

## 2013-04-26 DIAGNOSIS — O09299 Supervision of pregnancy with other poor reproductive or obstetric history, unspecified trimester: Secondary | ICD-10-CM | POA: Insufficient documentation

## 2013-04-26 DIAGNOSIS — O09529 Supervision of elderly multigravida, unspecified trimester: Secondary | ICD-10-CM | POA: Insufficient documentation

## 2013-04-26 DIAGNOSIS — O10019 Pre-existing essential hypertension complicating pregnancy, unspecified trimester: Secondary | ICD-10-CM | POA: Insufficient documentation

## 2013-04-26 DIAGNOSIS — IMO0002 Reserved for concepts with insufficient information to code with codable children: Secondary | ICD-10-CM

## 2013-04-26 DIAGNOSIS — E669 Obesity, unspecified: Secondary | ICD-10-CM | POA: Insufficient documentation

## 2013-04-26 DIAGNOSIS — I10 Essential (primary) hypertension: Secondary | ICD-10-CM

## 2013-04-26 DIAGNOSIS — O34219 Maternal care for unspecified type scar from previous cesarean delivery: Secondary | ICD-10-CM | POA: Insufficient documentation

## 2013-04-26 DIAGNOSIS — Z3689 Encounter for other specified antenatal screening: Secondary | ICD-10-CM | POA: Insufficient documentation

## 2013-04-26 DIAGNOSIS — O24919 Unspecified diabetes mellitus in pregnancy, unspecified trimester: Secondary | ICD-10-CM | POA: Insufficient documentation

## 2013-04-26 DIAGNOSIS — O352XX0 Maternal care for (suspected) hereditary disease in fetus, not applicable or unspecified: Secondary | ICD-10-CM | POA: Insufficient documentation

## 2013-04-26 DIAGNOSIS — O24112 Pre-existing diabetes mellitus, type 2, in pregnancy, second trimester: Secondary | ICD-10-CM

## 2013-05-14 ENCOUNTER — Other Ambulatory Visit: Payer: Self-pay | Admitting: Obstetrics and Gynecology

## 2013-05-14 DIAGNOSIS — O163 Unspecified maternal hypertension, third trimester: Secondary | ICD-10-CM

## 2013-05-14 DIAGNOSIS — O09529 Supervision of elderly multigravida, unspecified trimester: Secondary | ICD-10-CM

## 2013-05-16 ENCOUNTER — Ambulatory Visit (HOSPITAL_COMMUNITY)
Admission: RE | Admit: 2013-05-16 | Discharge: 2013-05-16 | Disposition: A | Discharge status: Home / Self Care | Payer: BC Managed Care – PPO | Source: Ambulatory Visit | Attending: Obstetrics and Gynecology | Admitting: Obstetrics and Gynecology

## 2013-05-16 DIAGNOSIS — E669 Obesity, unspecified: Secondary | ICD-10-CM | POA: Insufficient documentation

## 2013-05-16 DIAGNOSIS — O09299 Supervision of pregnancy with other poor reproductive or obstetric history, unspecified trimester: Secondary | ICD-10-CM | POA: Insufficient documentation

## 2013-05-16 DIAGNOSIS — O09529 Supervision of elderly multigravida, unspecified trimester: Secondary | ICD-10-CM

## 2013-05-16 DIAGNOSIS — O99210 Obesity complicating pregnancy, unspecified trimester: Secondary | ICD-10-CM | POA: Insufficient documentation

## 2013-05-16 DIAGNOSIS — O352XX0 Maternal care for (suspected) hereditary disease in fetus, not applicable or unspecified: Secondary | ICD-10-CM | POA: Insufficient documentation

## 2013-05-16 DIAGNOSIS — O10019 Pre-existing essential hypertension complicating pregnancy, unspecified trimester: Secondary | ICD-10-CM | POA: Insufficient documentation

## 2013-05-16 DIAGNOSIS — O34219 Maternal care for unspecified type scar from previous cesarean delivery: Secondary | ICD-10-CM | POA: Insufficient documentation

## 2013-05-16 DIAGNOSIS — O163 Unspecified maternal hypertension, third trimester: Secondary | ICD-10-CM

## 2013-05-16 DIAGNOSIS — O24919 Unspecified diabetes mellitus in pregnancy, unspecified trimester: Secondary | ICD-10-CM | POA: Insufficient documentation

## 2013-05-17 LAB — CBC
HCT: 28.8 % — ABNORMAL LOW (ref 36.0–46.0)
Hemoglobin: 9.7 g/dL — ABNORMAL LOW (ref 12.0–15.0)
MCHC: 33.7 g/dL (ref 30.0–36.0)
MCV: 83 fL (ref 78.0–100.0)

## 2013-05-22 ENCOUNTER — Telehealth: Payer: Self-pay | Admitting: Cardiovascular Disease

## 2013-05-22 NOTE — Telephone Encounter (Signed)
Heparin can only be started once she is hospitalized, so the answer depends on her OB/GYN's plans. She should not take Lovenox for a minimum of 12 hours before the C-section.

## 2013-05-22 NOTE — Telephone Encounter (Signed)
error 

## 2013-05-22 NOTE — Telephone Encounter (Signed)
Message forwarded Ronnell Guadalajara CMA

## 2013-05-22 NOTE — Telephone Encounter (Signed)
Would like to know when she can stop lovenox and start heparin,because c-section is schedule for August 28th .Marland Kitchen Please call    Thanks

## 2013-05-27 NOTE — Telephone Encounter (Signed)
LMOM heparin can only be started once hospitalized which depends on her OB/GYN's plans and should not take Lovenox for a minimum of 12 hours before C-section.

## 2013-05-27 NOTE — Telephone Encounter (Signed)
LMOM heparin can only be started once hospitalized which depends on her OB/GYN's plans and should not take Lovenox for a minimum of 12 hours before C-section. 

## 2013-05-28 ENCOUNTER — Other Ambulatory Visit: Payer: Self-pay | Admitting: Cardiovascular Disease

## 2013-06-10 ENCOUNTER — Other Ambulatory Visit: Payer: Self-pay | Admitting: Obstetrics and Gynecology

## 2013-06-10 DIAGNOSIS — O3421 Maternal care for scar from previous cesarean delivery: Secondary | ICD-10-CM

## 2013-06-10 DIAGNOSIS — O99210 Obesity complicating pregnancy, unspecified trimester: Secondary | ICD-10-CM

## 2013-06-10 DIAGNOSIS — O10013 Pre-existing essential hypertension complicating pregnancy, third trimester: Secondary | ICD-10-CM

## 2013-06-10 DIAGNOSIS — O09523 Supervision of elderly multigravida, third trimester: Secondary | ICD-10-CM

## 2013-06-10 DIAGNOSIS — O24913 Unspecified diabetes mellitus in pregnancy, third trimester: Secondary | ICD-10-CM

## 2013-06-10 DIAGNOSIS — O09299 Supervision of pregnancy with other poor reproductive or obstetric history, unspecified trimester: Secondary | ICD-10-CM

## 2013-06-10 DIAGNOSIS — O352XX1 Maternal care for (suspected) hereditary disease in fetus, fetus 1: Secondary | ICD-10-CM

## 2013-06-11 ENCOUNTER — Ambulatory Visit (HOSPITAL_COMMUNITY)
Admission: RE | Admit: 2013-06-11 | Discharge: 2013-06-11 | Disposition: A | Discharge status: Home / Self Care | Payer: BC Managed Care – PPO | Source: Ambulatory Visit | Attending: Obstetrics and Gynecology | Admitting: Obstetrics and Gynecology

## 2013-06-11 DIAGNOSIS — O24919 Unspecified diabetes mellitus in pregnancy, unspecified trimester: Secondary | ICD-10-CM | POA: Insufficient documentation

## 2013-06-11 DIAGNOSIS — O09529 Supervision of elderly multigravida, unspecified trimester: Secondary | ICD-10-CM | POA: Insufficient documentation

## 2013-06-11 DIAGNOSIS — O352XX0 Maternal care for (suspected) hereditary disease in fetus, not applicable or unspecified: Secondary | ICD-10-CM | POA: Insufficient documentation

## 2013-06-11 DIAGNOSIS — O352XX1 Maternal care for (suspected) hereditary disease in fetus, fetus 1: Secondary | ICD-10-CM

## 2013-06-11 DIAGNOSIS — O34219 Maternal care for unspecified type scar from previous cesarean delivery: Secondary | ICD-10-CM | POA: Insufficient documentation

## 2013-06-11 DIAGNOSIS — O09299 Supervision of pregnancy with other poor reproductive or obstetric history, unspecified trimester: Secondary | ICD-10-CM | POA: Insufficient documentation

## 2013-06-11 DIAGNOSIS — O3421 Maternal care for scar from previous cesarean delivery: Secondary | ICD-10-CM

## 2013-06-11 DIAGNOSIS — E669 Obesity, unspecified: Secondary | ICD-10-CM | POA: Insufficient documentation

## 2013-06-11 DIAGNOSIS — O24913 Unspecified diabetes mellitus in pregnancy, third trimester: Secondary | ICD-10-CM

## 2013-06-11 DIAGNOSIS — O09523 Supervision of elderly multigravida, third trimester: Secondary | ICD-10-CM

## 2013-06-11 DIAGNOSIS — O10013 Pre-existing essential hypertension complicating pregnancy, third trimester: Secondary | ICD-10-CM

## 2013-06-11 DIAGNOSIS — O99210 Obesity complicating pregnancy, unspecified trimester: Secondary | ICD-10-CM | POA: Insufficient documentation

## 2013-06-11 DIAGNOSIS — O10019 Pre-existing essential hypertension complicating pregnancy, unspecified trimester: Secondary | ICD-10-CM | POA: Insufficient documentation

## 2013-06-18 ENCOUNTER — Other Ambulatory Visit: Payer: Self-pay | Admitting: Obstetrics and Gynecology

## 2013-06-24 ENCOUNTER — Telehealth: Payer: Self-pay | Admitting: Pharmacist Clinician (PhC)/ Clinical Pharmacy Specialist

## 2013-06-24 ENCOUNTER — Ambulatory Visit: Payer: BC Managed Care – PPO | Admitting: Pharmacist Clinician (PhC)/ Clinical Pharmacy Specialist

## 2013-06-24 NOTE — Telephone Encounter (Signed)
Pt is [redacted] wks pregnant, on lovenox 120mg  bid, OB wants to switch pt to therapeutic heparin sq.  Pt is scheduled for C-section on 8/28.  Spoke with Dr. Royann Shivers, who does not want pt on sq heparin - either continue with lovenox until 12 hrs prior to scheduled C-section or admit pt to hospital for IV heparin and monitoring.  Pt is aware of situation, has enough lovenox to continue thru Wednesday am (8/20).  Waiting for call-back from Dr. Debria Garret RN as to which option he would prefer

## 2013-06-27 NOTE — Telephone Encounter (Signed)
Spoke with [name] on Tuesday Aug 19 and he asked that we allow hematology to monitor patient in switch from Lovenox to heparin.  We agreed to that, he will make necessary arrangements.  Spoke with his RN on 8/20 and stressed to her that dosing needs to be therapeutic not prophylactic and that she should contact hematology for information.

## 2013-07-01 ENCOUNTER — Encounter (HOSPITAL_COMMUNITY): Payer: Self-pay

## 2013-07-02 ENCOUNTER — Encounter (HOSPITAL_COMMUNITY): Payer: Self-pay

## 2013-07-02 ENCOUNTER — Encounter (HOSPITAL_COMMUNITY)
Admission: RE | Admit: 2013-07-02 | Discharge: 2013-07-02 | Disposition: A | Discharge status: Home / Self Care | Payer: BC Managed Care – PPO | Source: Ambulatory Visit | Attending: Obstetrics and Gynecology | Admitting: Obstetrics and Gynecology

## 2013-07-02 VITALS — BP 163/96 | HR 76 | Resp 20 | Ht 61.0 in | Wt 288.0 lb

## 2013-07-02 DIAGNOSIS — Z6841 Body Mass Index (BMI) 40.0 and over, adult: Secondary | ICD-10-CM

## 2013-07-02 DIAGNOSIS — Z98891 History of uterine scar from previous surgery: Secondary | ICD-10-CM

## 2013-07-02 DIAGNOSIS — I10 Essential (primary) hypertension: Secondary | ICD-10-CM

## 2013-07-02 DIAGNOSIS — R69 Illness, unspecified: Secondary | ICD-10-CM

## 2013-07-02 HISTORY — DX: Cardiac murmur, unspecified: R01.1

## 2013-07-02 LAB — BASIC METABOLIC PANEL
CO2: 23 mEq/L (ref 19–32)
Chloride: 101 mEq/L (ref 96–112)
GFR calc Af Amer: >90 mL/min (ref >90)
Sodium: 137 mEq/L (ref 135–145)

## 2013-07-02 LAB — ABO/RH: ABO/RH(D): O POS

## 2013-07-02 LAB — CBC
HCT: 34 % — ABNORMAL LOW (ref 36.0–46.0)
Hemoglobin: 11.1 g/dL — ABNORMAL LOW (ref 12.0–15.0)
MCH: 28.2 pg (ref 26.0–34.0)
MCHC: 32.6 g/dL (ref 30.0–36.0)
MCV: 86.5 fL (ref 78.0–100.0)

## 2013-07-02 NOTE — Pre-Procedure Instructions (Signed)
Spoke with Dr Arby Barrette regarding patient's history (cardiac issues, DM, HTN, BMI 54). Patient on Lovenox. Dr Stefano Gaul is managing this for patient. Dr Stefano Gaul told patient to take Tuesday am and pm dose but none on Wednesday or Thursday-day of surgery. EKG from 04/2013 copy on chart.   Dr Arby Barrette reviewed copy of Cardiologist 04/2013 Notes and EKG.  Ok for surgery.

## 2013-07-02 NOTE — Patient Instructions (Addendum)
   Your procedure is scheduled on: Thursday, 8/28  Enter through the Main Entrance of [facility] at: 6 am Pick up the phone at the desk and dial [phone] and inform us of your arrival.  Please call this number if you have any problems the morning of surgery: [phone]  Remember: Do not eat or drink after midnight: Wednesday Take these medicines the morning of surgery with a SIP OF WATER:  Labetalol, diltiazen.  Patient instructed to take metformin Wed morning dose but to withhold Wed night dose and Thursday morning day of surgery dose.  Patient to follow MD's instructions on Lovenox.  Patient takes insulin at night only - usually 8 units.  Patient instructed to take 1/2 insulin dose -4 units on Wednesday night.  No insulin on Thursday morning.   Do not wear jewelry, make-up, or FINGER nail polish No metal in your hair or on your body. Do not wear lotions, powders, perfumes. You may wear deodorant.  Please use your CHG wash as directed prior to surgery.  Do not shave anywhere for at least 12 hours prior to first CHG shower.  Do not bring valuables to the hospital. Contacts, dentures or bridgework may not be worn into surgery.  Leave suitcase in the car. After Surgery it may be brought to your room. For patients being admitted to the hospital, checkout time is 11:00am the day of discharge.  Home with husband B J

## 2013-07-03 NOTE — H&P (Signed)
Admission History and Physical Exam for an Obstetrics Patient  Joy Le is a 37 y.o. female, G2P1001, at [redacted]w[redacted]d gestation by LMP and first trimester [place] that confirms, who presents for a repeat cesarean section and tubal sterilization procedure. She has been followed at the [facility] and Gynecology division of [facility].  Her pregnancy has been complicated by hypertension, type 1 diabetes, atrial fibrillation, and obesity. See history below.  OB History   Grav Para Term Preterm Abortions TAB SAB Ect Mult Living   2 1 1       1       Past Medical History  Diagnosis Date  . Resistance to insulin   . ASD (atrial septal defect)     ASD repair @ age 62 years old  . Irregular menstrual cycle   . Cystitis   . Diabetes mellitus     Type I GDM  . Hypertension     Chronic HTN  . H/O: obesity   . H/O menorrhagia 04/28/04  . Oligomenorrhea 04/28/04  . Irregular menses 10/27/06  . Congenital heart defect     Atrial ventricular defect  . Gestational diabetes 2008  . Cleft leaflet, mitral valve     anterior  . Systemic hypertension   . Heart murmur     at birth - yearly check ups  . Asthma     No longer has - had only with 1st preg 2008  . Headache(784.0)     otc meds prn  . Atrial flutter     x 2 - a fib    The patient's medication list includes Lovenox, metformin, labetalol, Diltiazem cd, and prenatal vits. Her last dose of Lovenox was at 11:00 PM on July 02, 2013.  Past Surgical History  Procedure Laterality Date  . Wisdom tooth extraction    . Cesarean section  2008  . Asd repair  1984    [facility]  . Cardioversion  01/25/2011    successful  . Cardiac catheterization  04/27/2005    No sign. CAD,mod. MR,elevated LV end-diastolic pressure,EF 50% w/mild anterolateral hypokinesis  . US echocardiography  03/29/2011    mild to mod MR, mild TR,EF 60-65%    Allergies  Allergen Reactions  . Codeine Other (See Comments)    Hallucination     Family History: family history includes Alcohol abuse in her paternal grandfather; Alzheimer's disease in her maternal grandfather; Anemia in her sister; Diabetes in her maternal aunt, maternal grandmother, mother, and paternal aunt; Hypertension in her father, mother, and sister; Lung cancer in her paternal grandfather; Stroke in her mother.  Social History:  reports that she has never smoked. She has never used smokeless tobacco. She reports that  drinks alcohol. She reports that she does not use illicit drugs.  Review of systems: Normal pregnancy complaints.  Admission Physical Exam:    There is no weight on file to calculate BMI.  Last menstrual period 10/05/2012.  HEENT:                 Within normal limits Chest:                   Clear Heart:                    Regular rate and rhythm Abdomen:             Gravid and nontender Extremities:          Grossly  normal Neurologic exam: Grossly normal Pelvic exam:         Cervix: c/l.  Prenatal labs: ABO, Rh:             --/--/O POS, O POS (08/26 0835) HBsAg:                 NEGATIVE (01/20 1438)  HIV:                       NON REACTIVE (01/20 1438)  GBS:                      neg Antibody:              NEG (08/26 0835) Rubella:                 immune RPR:                    NON REACTIVE (08/26 0835)      Prenatal Transfer Tool  Maternal Diabetes: Yes:  Diabetes Type:  Insulin/Medication controlled Genetic Screening: Declined Maternal Ultrasounds/Referrals: Normal Fetal Ultrasounds or other Referrals:  Fetal echo- normal Maternal Substance Abuse:  No Significant Maternal Medications:  Meds include: Other:  Lovenox, insulin, labetalol,diltiazem, Xarelto (stopped earlier in pregnancy). Significant Maternal Lab Results:  None Other Comments:  Received TDap in June 2014.  Assessment:  [redacted]w[redacted]d gestation  Hypertension  Diabetes  Atrial fibrillation  Obesity  Desires sterilization  Prior cesarean section  Desires  repeat cesarean section  Plan:  The patient will have a repeat low transverse cesarean section and tubal sterilization procedure.  She understands the indications for her surgical procedure.  She accepts the risk of, but not limited to, anesthetic complications, bleeding, infections, and possible damage to surrounding organs.  She understands and accepts the failure rate associated with tubal sterilization (17 per 1000).   Janine Limbo 07/03/2013, 5:23 PM

## 2013-07-04 ENCOUNTER — Encounter (HOSPITAL_COMMUNITY)
Admission: RE | Disposition: A | Discharge status: Home / Self Care | Payer: Self-pay | Source: Ambulatory Visit | Attending: Obstetrics and Gynecology

## 2013-07-04 ENCOUNTER — Inpatient Hospital Stay (HOSPITAL_COMMUNITY): Payer: BC Managed Care – PPO | Admitting: Anesthesiology

## 2013-07-04 ENCOUNTER — Encounter (HOSPITAL_COMMUNITY): Payer: Self-pay | Admitting: Anesthesiology

## 2013-07-04 ENCOUNTER — Inpatient Hospital Stay (HOSPITAL_COMMUNITY)
Admission: RE | Admit: 2013-07-04 | Discharge: 2013-07-08 | DRG: 370 | Disposition: A | Discharge status: Home / Self Care | Payer: BC Managed Care – PPO | Source: Ambulatory Visit | Attending: Obstetrics and Gynecology | Admitting: Obstetrics and Gynecology

## 2013-07-04 DIAGNOSIS — O365990 Maternal care for other known or suspected poor fetal growth, unspecified trimester, not applicable or unspecified: Secondary | ICD-10-CM | POA: Diagnosis present

## 2013-07-04 DIAGNOSIS — I4891 Unspecified atrial fibrillation: Secondary | ICD-10-CM | POA: Diagnosis present

## 2013-07-04 DIAGNOSIS — E109 Type 1 diabetes mellitus without complications: Secondary | ICD-10-CM | POA: Diagnosis present

## 2013-07-04 DIAGNOSIS — R69 Illness, unspecified: Secondary | ICD-10-CM

## 2013-07-04 DIAGNOSIS — D649 Anemia, unspecified: Secondary | ICD-10-CM | POA: Diagnosis not present

## 2013-07-04 DIAGNOSIS — E669 Obesity, unspecified: Secondary | ICD-10-CM | POA: Diagnosis present

## 2013-07-04 DIAGNOSIS — IMO0002 Reserved for concepts with insufficient information to code with codable children: Secondary | ICD-10-CM | POA: Diagnosis present

## 2013-07-04 DIAGNOSIS — O2432 Unspecified pre-existing diabetes mellitus in childbirth: Secondary | ICD-10-CM | POA: Diagnosis present

## 2013-07-04 DIAGNOSIS — Z794 Long term (current) use of insulin: Secondary | ICD-10-CM

## 2013-07-04 DIAGNOSIS — O34219 Maternal care for unspecified type scar from previous cesarean delivery: Principal | ICD-10-CM | POA: Diagnosis present

## 2013-07-04 DIAGNOSIS — I10 Essential (primary) hypertension: Secondary | ICD-10-CM

## 2013-07-04 DIAGNOSIS — Z6841 Body Mass Index (BMI) 40.0 and over, adult: Secondary | ICD-10-CM

## 2013-07-04 DIAGNOSIS — Z98891 History of uterine scar from previous surgery: Secondary | ICD-10-CM

## 2013-07-04 DIAGNOSIS — O9903 Anemia complicating the puerperium: Secondary | ICD-10-CM | POA: Diagnosis not present

## 2013-07-04 LAB — GLUCOSE, CAPILLARY
Glucose-Capillary: 103 mg/dL — ABNORMAL HIGH (ref 70–99)
Glucose-Capillary: 106 mg/dL — ABNORMAL HIGH (ref 70–99)

## 2013-07-04 SURGERY — Surgical Case
Anesthesia: Spinal | Site: Abdomen | Laterality: Bilateral | Wound class: Clean Contaminated

## 2013-07-04 MED ORDER — ENOXAPARIN SODIUM 120 MG/0.8ML ~~LOC~~ SOLN
120.0000 mg | Freq: Two times a day (BID) | SUBCUTANEOUS | Status: DC
  Administered 2013-07-04 – 2013-07-06 (×4): 120 mg via SUBCUTANEOUS
  Filled 2013-07-04 (×8): qty 0.8

## 2013-07-04 MED ORDER — DIBUCAINE 1 % RE OINT: 1.0000 "application " | TOPICAL_OINTMENT | RECTAL | Status: DC | PRN

## 2013-07-04 MED ORDER — NALBUPHINE HCL 10 MG/ML IJ SOLN
5.0000 mg | INTRAMUSCULAR | Status: DC | PRN
  Filled 2013-07-04: qty 1

## 2013-07-04 MED ORDER — BUPIVACAINE-EPINEPHRINE (PF) 0.5% -1:200000 IJ SOLN
INTRAMUSCULAR | Status: AC
  Filled 2013-07-04: qty 10

## 2013-07-04 MED ORDER — IBUPROFEN 600 MG PO TABS
600.0000 mg | ORAL_TABLET | Freq: Four times a day (QID) | ORAL | Status: DC
  Administered 2013-07-04 – 2013-07-08 (×16): 600 mg via ORAL
  Filled 2013-07-04 (×17): qty 1

## 2013-07-04 MED ORDER — DILTIAZEM HCL ER COATED BEADS 120 MG PO CP24
120.0000 mg | ORAL_CAPSULE | Freq: Every day | ORAL | Status: DC
  Administered 2013-07-05 – 2013-07-08 (×4): 120 mg via ORAL
  Filled 2013-07-04 (×5): qty 1

## 2013-07-04 MED ORDER — DEXTROSE 5 % IV SOLN
3.0000 g | INTRAVENOUS | Status: AC
  Administered 2013-07-04: 3 g via INTRAVENOUS
  Filled 2013-07-04: qty 3000

## 2013-07-04 MED ORDER — SODIUM CHLORIDE 0.9 % IJ SOLN: 3.0000 mL | INTRAMUSCULAR | Status: DC | PRN

## 2013-07-04 MED ORDER — LANOLIN HYDROUS EX OINT: 1.0000 "application " | TOPICAL_OINTMENT | CUTANEOUS | Status: DC | PRN

## 2013-07-04 MED ORDER — FENTANYL CITRATE 0.05 MG/ML IJ SOLN
INTRAMUSCULAR | Status: AC
  Filled 2013-07-04: qty 2

## 2013-07-04 MED ORDER — MORPHINE SULFATE 0.5 MG/ML IJ SOLN
INTRAMUSCULAR | Status: AC
  Filled 2013-07-04: qty 10

## 2013-07-04 MED ORDER — SIMETHICONE 80 MG PO CHEW
80.0000 mg | CHEWABLE_TABLET | Freq: Three times a day (TID) | ORAL | Status: DC
  Administered 2013-07-04 – 2013-07-07 (×14): 80 mg via ORAL

## 2013-07-04 MED ORDER — VITAMIN B-12 1000 MCG PO TABS
1000.0000 ug | ORAL_TABLET | Freq: Every day | ORAL | Status: DC
  Administered 2013-07-04 – 2013-07-08 (×5): 1000 ug via ORAL
  Filled 2013-07-04 (×5): qty 1

## 2013-07-04 MED ORDER — OXYCODONE-ACETAMINOPHEN 5-325 MG PO TABS
1.0000 | ORAL_TABLET | ORAL | Status: DC | PRN
  Administered 2013-07-05 (×2): 1 via ORAL
  Administered 2013-07-05: 2 via ORAL
  Administered 2013-07-05: 1 via ORAL
  Administered 2013-07-06 – 2013-07-07 (×6): 2 via ORAL
  Administered 2013-07-07: 1 via ORAL
  Administered 2013-07-07: 2 via ORAL
  Filled 2013-07-04: qty 1
  Filled 2013-07-04 (×2): qty 2
  Filled 2013-07-04: qty 1
  Filled 2013-07-04 (×3): qty 2
  Filled 2013-07-04: qty 1
  Filled 2013-07-04 (×4): qty 2
  Filled 2013-07-04: qty 1

## 2013-07-04 MED ORDER — SIMETHICONE 80 MG PO CHEW
80.0000 mg | CHEWABLE_TABLET | ORAL | Status: DC | PRN
  Administered 2013-07-07 (×2): 80 mg via ORAL

## 2013-07-04 MED ORDER — FENTANYL CITRATE 0.05 MG/ML IJ SOLN
INTRAMUSCULAR | Status: DC | PRN
  Administered 2013-07-04: 75 ug via INTRAVENOUS
  Administered 2013-07-04: 25 ug via INTRATHECAL

## 2013-07-04 MED ORDER — SCOPOLAMINE 1 MG/3DAYS TD PT72
1.0000 | MEDICATED_PATCH | Freq: Once | TRANSDERMAL | Status: DC
  Filled 2013-07-04: qty 1

## 2013-07-04 MED ORDER — LACTATED RINGERS IV SOLN
INTRAVENOUS | Status: DC
Start: 2013-07-04 — End: 2013-07-04
  Administered 2013-07-04 (×2): via INTRAVENOUS

## 2013-07-04 MED ORDER — LABETALOL HCL 5 MG/ML IV SOLN
10.0000 mg | Freq: Once | INTRAVENOUS | Status: DC
  Filled 2013-07-04: qty 8

## 2013-07-04 MED ORDER — SCOPOLAMINE 1 MG/3DAYS TD PT72
MEDICATED_PATCH | TRANSDERMAL | Status: AC
  Administered 2013-07-04: 1.5 mg via TRANSDERMAL
  Filled 2013-07-04: qty 1

## 2013-07-04 MED ORDER — METFORMIN HCL 500 MG PO TABS
1000.0000 mg | ORAL_TABLET | Freq: Two times a day (BID) | ORAL | Status: DC
  Administered 2013-07-04 – 2013-07-08 (×7): 1000 mg via ORAL
  Filled 2013-07-04 (×9): qty 2

## 2013-07-04 MED ORDER — OXYTOCIN 10 UNIT/ML IJ SOLN
INTRAMUSCULAR | Status: AC
  Filled 2013-07-04: qty 4

## 2013-07-04 MED ORDER — DIPHENHYDRAMINE HCL 25 MG PO CAPS: 25.0000 mg | ORAL_CAPSULE | Freq: Four times a day (QID) | ORAL | Status: DC | PRN

## 2013-07-04 MED ORDER — ONDANSETRON HCL 4 MG/2ML IJ SOLN: 4.0000 mg | INTRAMUSCULAR | Status: DC | PRN

## 2013-07-04 MED ORDER — OXYTOCIN 40 UNITS IN LACTATED RINGERS INFUSION - SIMPLE MED: 62.5000 mL/h | INTRAVENOUS | Status: AC

## 2013-07-04 MED ORDER — MENTHOL 3 MG MT LOZG: 1.0000 | LOZENGE | OROMUCOSAL | Status: DC | PRN

## 2013-07-04 MED ORDER — KETOROLAC TROMETHAMINE 30 MG/ML IJ SOLN: 30.0000 mg | Freq: Four times a day (QID) | INTRAMUSCULAR | Status: AC | PRN

## 2013-07-04 MED ORDER — DIPHENHYDRAMINE HCL 50 MG/ML IJ SOLN: 12.5000 mg | INTRAMUSCULAR | Status: DC | PRN

## 2013-07-04 MED ORDER — CHLOROPROCAINE HCL 3 % IJ SOLN
INTRAMUSCULAR | Status: AC
  Filled 2013-07-04: qty 20

## 2013-07-04 MED ORDER — BUPIVACAINE IN DEXTROSE 0.75-8.25 % IT SOLN
INTRATHECAL | Status: AC
  Filled 2013-07-04: qty 2

## 2013-07-04 MED ORDER — ONDANSETRON HCL 4 MG/2ML IJ SOLN
INTRAMUSCULAR | Status: DC | PRN
  Administered 2013-07-04: 4 mg via INTRAVENOUS

## 2013-07-04 MED ORDER — ZOLPIDEM TARTRATE 5 MG PO TABS: 5.0000 mg | ORAL_TABLET | Freq: Every evening | ORAL | Status: DC | PRN

## 2013-07-04 MED ORDER — ONDANSETRON HCL 4 MG/2ML IJ SOLN: 4.0000 mg | Freq: Three times a day (TID) | INTRAMUSCULAR | Status: DC | PRN

## 2013-07-04 MED ORDER — BUPIVACAINE-EPINEPHRINE 0.5% -1:200000 IJ SOLN
INTRAMUSCULAR | Status: DC | PRN
  Administered 2013-07-04: 30 mL

## 2013-07-04 MED ORDER — LACTATED RINGERS IV SOLN
INTRAVENOUS | Status: DC
  Administered 2013-07-04 – 2013-07-07 (×4): via INTRAVENOUS

## 2013-07-04 MED ORDER — LACTATED RINGERS IV SOLN
INTRAVENOUS | Status: DC | PRN
  Administered 2013-07-04: 08:00:00 via INTRAVENOUS

## 2013-07-04 MED ORDER — NALOXONE HCL 1 MG/ML IJ SOLN
1.0000 ug/kg/h | INTRAVENOUS | Status: DC | PRN
  Filled 2013-07-04: qty 2

## 2013-07-04 MED ORDER — ONDANSETRON HCL 4 MG/2ML IJ SOLN
INTRAMUSCULAR | Status: AC
  Filled 2013-07-04: qty 2

## 2013-07-04 MED ORDER — MORPHINE SULFATE (PF) 0.5 MG/ML IJ SOLN
INTRAMUSCULAR | Status: DC | PRN
  Administered 2013-07-04: 4.85 mg via INTRAVENOUS
  Administered 2013-07-04: .15 mg via INTRATHECAL

## 2013-07-04 MED ORDER — ONDANSETRON HCL 4 MG PO TABS: 4.0000 mg | ORAL_TABLET | ORAL | Status: DC | PRN

## 2013-07-04 MED ORDER — EPHEDRINE SULFATE 50 MG/ML IJ SOLN
INTRAMUSCULAR | Status: DC | PRN
  Administered 2013-07-04: 5 mg via INTRAVENOUS

## 2013-07-04 MED ORDER — KETOROLAC TROMETHAMINE 60 MG/2ML IM SOLN
60.0000 mg | Freq: Once | INTRAMUSCULAR | Status: AC | PRN
  Administered 2013-07-04: 60 mg via INTRAMUSCULAR

## 2013-07-04 MED ORDER — 0.9 % SODIUM CHLORIDE (POUR BTL) OPTIME
TOPICAL | Status: DC | PRN
  Administered 2013-07-04: 1000 mL

## 2013-07-04 MED ORDER — DIPHENHYDRAMINE HCL 50 MG/ML IJ SOLN: 25.0000 mg | INTRAMUSCULAR | Status: DC | PRN

## 2013-07-04 MED ORDER — KETOROLAC TROMETHAMINE 60 MG/2ML IM SOLN
INTRAMUSCULAR | Status: AC
  Administered 2013-07-04: 60 mg via INTRAMUSCULAR
  Filled 2013-07-04: qty 2

## 2013-07-04 MED ORDER — PRENATAL MULTIVITAMIN CH
1.0000 | ORAL_TABLET | Freq: Every day | ORAL | Status: DC
  Administered 2013-07-04 – 2013-07-08 (×5): 1 via ORAL
  Filled 2013-07-04 (×6): qty 1

## 2013-07-04 MED ORDER — DIPHENHYDRAMINE HCL 25 MG PO CAPS: 25.0000 mg | ORAL_CAPSULE | ORAL | Status: DC | PRN

## 2013-07-04 MED ORDER — MEDROXYPROGESTERONE ACETATE 150 MG/ML IM SUSP: 150.0000 mg | INTRAMUSCULAR | Status: DC | PRN

## 2013-07-04 MED ORDER — SODIUM BICARBONATE 8.4 % IV SOLN
INTRAVENOUS | Status: DC | PRN
  Administered 2013-07-04: 5 mL via EPIDURAL
  Administered 2013-07-04: 3 mL via EPIDURAL

## 2013-07-04 MED ORDER — FENTANYL CITRATE 0.05 MG/ML IJ SOLN: 25.0000 ug | INTRAMUSCULAR | Status: DC | PRN

## 2013-07-04 MED ORDER — MEASLES, MUMPS & RUBELLA VAC ~~LOC~~ INJ
0.5000 mL | INJECTION | Freq: Once | SUBCUTANEOUS | Status: DC
  Filled 2013-07-04: qty 0.5

## 2013-07-04 MED ORDER — WITCH HAZEL-GLYCERIN EX PADS: 1.0000 "application " | MEDICATED_PAD | CUTANEOUS | Status: DC | PRN

## 2013-07-04 MED ORDER — MEPERIDINE HCL 25 MG/ML IJ SOLN: 6.2500 mg | INTRAMUSCULAR | Status: DC | PRN

## 2013-07-04 MED ORDER — BUPIVACAINE IN DEXTROSE 0.75-8.25 % IT SOLN
INTRATHECAL | Status: DC | PRN
  Administered 2013-07-04: 1.4 mL via INTRATHECAL

## 2013-07-04 MED ORDER — SCOPOLAMINE 1 MG/3DAYS TD PT72
1.0000 | MEDICATED_PATCH | Freq: Once | TRANSDERMAL | Status: DC
  Administered 2013-07-04: 1.5 mg via TRANSDERMAL

## 2013-07-04 MED ORDER — NALOXONE HCL 0.4 MG/ML IJ SOLN: 0.4000 mg | INTRAMUSCULAR | Status: DC | PRN

## 2013-07-04 MED ORDER — METOCLOPRAMIDE HCL 5 MG/ML IJ SOLN: 10.0000 mg | Freq: Three times a day (TID) | INTRAMUSCULAR | Status: DC | PRN

## 2013-07-04 MED ORDER — LACTATED RINGERS IV SOLN: INTRAVENOUS | Status: DC

## 2013-07-04 MED ORDER — SENNOSIDES-DOCUSATE SODIUM 8.6-50 MG PO TABS
2.0000 | ORAL_TABLET | Freq: Every day | ORAL | Status: DC
  Administered 2013-07-04 – 2013-07-07 (×4): 2 via ORAL

## 2013-07-04 MED ORDER — PRENATAL MULTIVITAMIN CH: 1.0000 | ORAL_TABLET | Freq: Every day | ORAL | Status: DC

## 2013-07-04 MED ORDER — LABETALOL HCL 100 MG PO TABS
50.0000 mg | ORAL_TABLET | Freq: Two times a day (BID) | ORAL | Status: DC
  Administered 2013-07-04 – 2013-07-05 (×3): 50 mg via ORAL
  Filled 2013-07-04 (×5): qty 0.5

## 2013-07-04 MED ORDER — OXYTOCIN 10 UNIT/ML IJ SOLN
40.0000 [IU] | INTRAVENOUS | Status: DC | PRN
  Administered 2013-07-04: 08:00:00 via INTRAVENOUS
  Administered 2013-07-04: 40 [IU] via INTRAVENOUS

## 2013-07-04 SURGICAL SUPPLY — 41 items
CLAMP CORD UMBIL (MISCELLANEOUS) IMPLANT
CLOTH BEACON ORANGE TIMEOUT ST (SAFETY) ×2 IMPLANT
CONTAINER PREFILL 10% NBF 15ML (MISCELLANEOUS) IMPLANT
DRAIN JACKSON PRT FLT 7MM (DRAIN) ×1 IMPLANT
DRAPE LG THREE QUARTER DISP (DRAPES) ×2 IMPLANT
DRSG OPSITE POSTOP 4X10 (GAUZE/BANDAGES/DRESSINGS) ×2 IMPLANT
DURAPREP 26ML APPLICATOR (WOUND CARE) ×2 IMPLANT
ELECT REM PT RETURN 9FT ADLT (ELECTROSURGICAL) ×2
ELECTRODE REM PT RTRN 9FT ADLT (ELECTROSURGICAL) ×1 IMPLANT
EVACUATOR SILICONE 100CC (DRAIN) ×1 IMPLANT
EXTRACTOR VACUUM M CUP 4 TUBE (SUCTIONS) IMPLANT
GLOVE BIOGEL PI IND STRL 8.5 (GLOVE) ×1 IMPLANT
GLOVE BIOGEL PI INDICATOR 8.5 (GLOVE) ×1
GLOVE ECLIPSE 8.0 STRL XLNG CF (GLOVE) ×4 IMPLANT
GOWN PREVENTION PLUS XXLARGE (GOWN DISPOSABLE) ×2 IMPLANT
GOWN STRL REIN XL XLG (GOWN DISPOSABLE) ×4 IMPLANT
KIT ABG SYR 3ML LUER SLIP (SYRINGE) IMPLANT
NDL HYPO 25X1 1.5 SAFETY (NEEDLE) ×1 IMPLANT
NDL HYPO 25X5/8 SAFETYGLIDE (NEEDLE) IMPLANT
NEEDLE HYPO 25X1 1.5 SAFETY (NEEDLE) ×2 IMPLANT
NEEDLE HYPO 25X5/8 SAFETYGLIDE (NEEDLE) IMPLANT
PACK C SECTION WH (CUSTOM PROCEDURE TRAY) ×2 IMPLANT
PAD ABD 7.5X8 STRL (GAUZE/BANDAGES/DRESSINGS) ×1 IMPLANT
PAD OB MATERNITY 4.3X12.25 (PERSONAL CARE ITEMS) ×2 IMPLANT
RINGERS IRRIG 1000ML POUR BTL (IV SOLUTION) ×2 IMPLANT
STAPLER VISISTAT 35W (STAPLE) IMPLANT
SUT MNCRL AB 3-0 PS2 27 (SUTURE) IMPLANT
SUT PLAIN 0 NONE (SUTURE) ×1 IMPLANT
SUT SILK 3 0 FS 1X18 (SUTURE) ×1 IMPLANT
SUT VIC AB 0 CT1 27 (SUTURE) ×6
SUT VIC AB 0 CT1 27XBRD ANBCTR (SUTURE) ×2 IMPLANT
SUT VIC AB 2-0 CTX 36 (SUTURE) ×4 IMPLANT
SUT VIC AB 3-0 CT1 27 (SUTURE)
SUT VIC AB 3-0 CT1 TAPERPNT 27 (SUTURE) IMPLANT
SUT VIC AB 3-0 SH 27 (SUTURE)
SUT VIC AB 3-0 SH 27X BRD (SUTURE) IMPLANT
SYR CONTROL 10ML LL (SYRINGE) ×2 IMPLANT
TAPE CLOTH SURG 4X10 WHT LF (GAUZE/BANDAGES/DRESSINGS) ×1 IMPLANT
TOWEL OR 17X24 6PK STRL BLUE (TOWEL DISPOSABLE) ×2 IMPLANT
TRAY FOLEY CATH 14FR (SET/KITS/TRAYS/PACK) ×2 IMPLANT
WATER STERILE IRR 1000ML POUR (IV SOLUTION) ×2 IMPLANT

## 2013-07-04 NOTE — Transfer of Care (Signed)
Immediate Anesthesia Transfer of Care Note  Patient: Joy Le  Procedure(s) Performed: Procedure(s): REPEAT CESAREAN SECTION WITH BILATERAL TUBAL LIGATION (Bilateral)  Patient Location: PACU  Anesthesia Type:Regional  Level of Consciousness: awake, alert , oriented and patient cooperative  Airway & Oxygen Therapy: Patient Spontanous Breathing  Post-op Assessment: Report given to PACU RN and Post -op Vital signs reviewed and stable  Post vital signs: Reviewed and stable  Complications: No apparent anesthesia complications

## 2013-07-04 NOTE — Anesthesia Postprocedure Evaluation (Signed)
Anesthesia Post Note  Patient: Joy Le  Procedure(s) Performed: Procedure(s) (LRB): REPEAT CESAREAN SECTION WITH BILATERAL TUBAL LIGATION (Bilateral)  Anesthesia type: Spinal  Patient location: PACU  Post pain: Pain level controlled  Post assessment: Post-op Vital signs reviewed  Last Vitals:  Filed Vitals:   07/04/13 0930  BP: 159/87  Pulse: 64  Temp:   Resp: 18    Post vital signs: Reviewed  Level of consciousness: awake  Complications: No apparent anesthesia complications

## 2013-07-04 NOTE — Lactation Note (Signed)
This note was copied from the chart of Boy Satoya Feeley. Lactation Consultation Note Initial consultation with this mother, s/p c-section; mom attempted to breast feed her first child (now 35 years old), but had low milk supply, despite good lactation support and pumping.  Mom has h/o type 2 diabetes. Discussed breast feeding strategies at length, reviewed baby and me book, encouraged mom to seek lactation support; support services, community resources, and BFSG reviewed.   Offered to assist mom with a latch, mom accepts. Discussed hand expression, mom is able to hand express drops of colostrum. Mom still has limited mobility from surgery (baby is now 14 hours old), extensive assistance was required for positioning the baby.  Baby did latch for a few suckles, then went to sleep.  Inst mom to continue frequent STS and cue based feeding. Enc mom to call for assistance when needed. Questions answered.  Patient Name: Boy Chimamanda Siegfried [id number] Date: 07/04/2013 Reason for consult: Initial assessment   Maternal Data Formula Feeding for Exclusion: No Infant to breast within first hour of birth: No Breastfeeding delayed due to:: Infant status Has patient been taught Hand Expression?: Yes Does the patient have breastfeeding experience prior to this delivery?: Yes  Feeding Feeding Type: Breast Milk  LATCH Score/Interventions Latch: Too sleepy or reluctant, no latch achieved, no sucking elicited.  Audible Swallowing: None  Type of Nipple: Everted at rest and after stimulation  Comfort (Breast/Nipple): Soft / non-tender     Hold (Positioning): Full assist, staff holds infant at breast Intervention(s): Breastfeeding basics reviewed;Support Pillows;Position options;Skin to skin  LATCH Score: 4  Lactation Tools Discussed/Used     Consult Status Consult Status: Follow-up Follow-up type: In-patient    Octavio Manns [facility] 07/04/2013, 2:46 PM

## 2013-07-04 NOTE — Anesthesia Procedure Notes (Signed)
Spinal  Patient location during procedure: OR Start time: 07/04/2013 7:40 AM Staffing Anesthesiologist: Keyan Folson A. Performed by: anesthesiologist  Preanesthetic Checklist Completed: patient identified, site marked, surgical consent, pre-op evaluation, timeout performed, IV checked, risks and benefits discussed and monitors and equipment checked Spinal Block Patient position: sitting Prep: site prepped and draped and DuraPrep Patient monitoring: cardiac monitor, continuous pulse ox, blood pressure and heart rate Approach: midline Location: L3-4 Injection technique: catheter Needle Needle type: Tuohy and Sprotte  Needle gauge: 24 G Needle length: 12.7 cm Needle insertion depth: 8 cm Catheter type: closed end flexible Catheter size: 19 g Assessment Sensory level: T4 Additional Notes CSE performed. Spinal through epidural needle. CSF clear, free flow, no heme, no paresthesias.  Spinal medications injected, spinal needle withdrawn and epidural catheter threaded 6cm into epidural space. Adequate sensory level.

## 2013-07-04 NOTE — Consult Note (Signed)
Neonatology Note:  Attendance at C-section:  I was asked by Dr. Stringer to attend this repeat C/S at 37 6/7 weeks. The mother is a G2P1 O pos, GBS neg with insulin-dependent diabetes, chronic HTN, obesity, ASD with atrial fibrillation, and IUGR infant . She is on Labetalol, Lovenox, Metformin, Insulin, and Diltiazem. ROM at delivery, fluid clear. Infant had a spontaneous cry and good tone at birth, but copious oral secretions, which we bulb suctioned multiple times. We DeLee suctioned and got 12-14 ml of clear fluid out. We placed a pulse oximeter, which was reading 69% in room air, so BBO2 was given. We did chest PT to help the baby clear the fluid from his lungs, then repeated DeLee suctioning. He sounded much clearer, but, after weaning the BBO2 away, his O2 saturations were varying between 85-91% in room air and he was having mild nasal flaring and minimal sternal retractions. We allowed him to be held by his mother for 1-2 min, then took him to the CN to complete transitioning. His father was in attendance. Ap 7/9. Lungs clear to ausc in DR after the above. To CN to care of Pediatrician.  Joy Finnigan C. Leiby Pigeon, MD  

## 2013-07-04 NOTE — H&P (Signed)
The patient was interviewed and examined today.  The previously documented history and physical examination was reviewed. There are no changes. The operative procedure was reviewed. The risks and benefits were outlined again. The specific risks include, but are not limited to, anesthetic complications, bleeding, infections, and possible damage to the surrounding organs. The patient's questions were answered.  We are ready to proceed as outlined. The likelihood of the patient achieving the goals of this procedure is very likely.   BP 182/92  Pulse 79  Temp(Src) 98.1 F (36.7 C) (Oral)  Resp 20  SpO2 99%  LMP 10/05/2012  CBC    Component Value Date/Time   WBC 6.3 07/02/2013 0835   RBC 3.93 07/02/2013 0835   HGB 11.1* 07/02/2013 0835   HCT 34.0* 07/02/2013 0835   PLT 202 07/02/2013 0835   MCV 86.5 07/02/2013 0835   MCH 28.2 07/02/2013 0835   MCHC 32.6 07/02/2013 0835   RDW 14.9 07/02/2013 0835   LYMPHSABS 3.2 11/26/2012 1438   MONOABS 0.7 11/26/2012 1438   EOSABS 0.1 11/26/2012 1438   BASOSABS 0.0 11/26/2012 1438    Leonard Schwartz, M.D.

## 2013-07-04 NOTE — Anesthesia Postprocedure Evaluation (Signed)
  Anesthesia Post-op Note  Patient: Joy Le  Procedure(s) Performed: Procedure(s): REPEAT CESAREAN SECTION WITH BILATERAL TUBAL LIGATION (Bilateral)  Patient Location: PACU and Mother/Baby  Anesthesia Type:Spinal  Level of Consciousness: awake, alert , oriented and patient cooperative  Airway and Oxygen Therapy: Patient Spontanous Breathing  Post-op Pain: none  Post-op Assessment: Post-op Vital signs reviewed, Patient's Cardiovascular Status Stable and Respiratory Function Stable  Post-op Vital Signs: Reviewed and stable  Complications: No apparent anesthesia complications

## 2013-07-04 NOTE — Op Note (Signed)
OPERATIVE NOTE  Patient's Name: Joy Le  Date of Birth: 1975/12/08   Medical Records Number: [id number]   Date of Operation: 07/04/2013   Preoperative diagnosis:  [redacted]w[redacted]d weeks gestation  Previous C/S, Desire for Sterilzation, Hypertension, Diabetes;   Desires Repeat Cesarean Section  Obesity  Hypertension  Diabetes  Atrial fibrillation  Anemia  Postoperative diagnosis:  [redacted]w[redacted]d weeks gestation  previous ceserean section, desires sterilization  Desires Repeat Cesarean Section  Obesity  Hypertension  Diabetes.  Atrial fibrillation.  Anemia  Procedure:  Repeat low transverse cesarean section  Bilateral tubal sterilization procedure  Surgeon:  Leonard Schwartz, M.D.  Assistant:  Henreitta Leber, PA-C  Anesthesia:  Spinal  Disposition:  Joy Le is a 37 y.o. female, who presents at [redacted]w[redacted]d weeks gestation. The patient has been followed at the [facility] obstetrics and gynecology division of [facility] care for women. This pregnancy has been complicated by a prior cesarean section. The patient desires a repeat cesarean section. She understands the indications for her procedure and she accepts the risk of, but not limited to, anesthetic complications, bleeding, infections, and possible damage to the surrounding organs.  Findings:  A  female Onalee Hua) was delivered from a OP position.  The Apgar scores were 7/8. The uterus, fallopian tubes, and ovaries were normal for the gravid state.  Procedure:  The patient was taken to the operating room where a spinal anesthetic was given.The perineum was prepped with betadine. A Foley catheter was placed in the bladder.The patient's abdomen was prepped with Duraprep.   The patient was sterilely draped. A "timeout" was performed which properly identified the patient and the correct operative procedure. The lower abdomen was injected with half percent Marcaine with epinephrine. A low  transverse incision was made in the abdomen and carried sharply through the subcutaneous tissue, the fascia, and the anterior peritoneum. An incision was made in the lower uterine segment. The incision was extended in a low transverse fashion. The membranes were ruptured. The fetal head was delivered without difficulty. The mouth and nose were suctioned. The remainder of the infant was then delivered. The cord was clamped and cut. The infant was handed to the awaiting pediatric team. The placenta was removed. The uterine cavity was cleaned of amniotic fluid, clotted blood, and membranes. The uterine incision was closed using a running locking suture of 2-0 Vicryl. An imbricating suture of 2-0 Vicryl was placed. The left fallopian tube was then applied and followed to its fimbriated end.  A knuckle of tube was made on the left using a free tie of 0 plain catgut followed by individual ties of 0 plain catgut.  The knuckle of tube was then excised.  An identical procedure was carried out on the opposite side. The pelvis was vigorously irrigated. Hemostasis was adequate. The anterior peritoneum and the abdominal musculature were closed using 2-0 Vicryl. The fascia was closed using a running suture of 0 Vicryl followed by 3 interrupted sutures of 0 Vicryl. A size 7 Jackson-Pratt drain was placed in the subcutaneous layer and brought out through the left lower quadrant.  It was sutured into place using 3-0 silk.The subcutaneous layer was closed using interrupted sutures of 2-0 Vicryl. The skin was reapproximated using a subcuticular suture of 3-0 Monocryl. Sponge, needle, and instrument counts were correct on 2 occasions. The estimated blood loss for the procedure was 800 cc. The patient tolerated her procedure well. She was transported to the recovery room in stable condition. The infant was  taken to the full-term nursery in stable condition. The placenta and fallopian tubes were sent to pathology.  Leonard Schwartz, M.D.

## 2013-07-04 NOTE — Anesthesia Preprocedure Evaluation (Addendum)
Anesthesia Evaluation  Patient identified by MRN, date of birth, ID band Patient awake    Reviewed: Allergy & Precautions, H&P , NPO status , Patient's Chart, lab work & pertinent test results  Airway Mallampati: II TM Distance: >3 FB Neck ROM: Full    Dental no notable dental hx. (+) Teeth Intact   Pulmonary neg pulmonary ROS,  breath sounds clear to auscultation  Pulmonary exam normal       Cardiovascular hypertension, Pt. on medications + dysrhythmias Atrial Fibrillation Rhythm:Regular Rate:Normal  S/p ASD repair at 37years old   Neuro/Psych negative neurological ROS  negative psych ROS   GI/Hepatic negative GI ROS, Neg liver ROS,   Endo/Other  diabetes, Type 1, Insulin DependentMorbid obesity  Renal/GU negative Renal ROS  negative genitourinary   Musculoskeletal negative musculoskeletal ROS (+)   Abdominal (+) + obese,   Peds negative pediatric ROS (+)  Hematology negative hematology ROS (+)   Anesthesia Other Findings   Reproductive/Obstetrics negative OB ROS (+) Pregnancy                          Anesthesia Physical Anesthesia Plan  ASA: III  Anesthesia Plan: Spinal   Post-op Pain Management:    Induction:   Airway Management Planned:   Additional Equipment:   Intra-op Plan:   Post-operative Plan:   Informed Consent: I have reviewed the patients History and Physical, chart, labs and discussed the procedure including the risks, benefits and alternatives for the proposed anesthesia with the patient or authorized representative who has indicated his/her understanding and acceptance.   Dental advisory given  Plan Discussed with: CRNA  Anesthesia Plan Comments: (SAB. Off lovenox since tuesday)        Anesthesia Quick Evaluation

## 2013-07-05 ENCOUNTER — Encounter (HOSPITAL_COMMUNITY): Payer: Self-pay | Admitting: Obstetrics and Gynecology

## 2013-07-05 LAB — TYPE AND SCREEN: Unit division: 0

## 2013-07-05 LAB — GLUCOSE, CAPILLARY: Glucose-Capillary: 87 mg/dL (ref 70–99)

## 2013-07-05 LAB — CBC
HCT: 29.9 % — ABNORMAL LOW (ref 36.0–46.0)
Hemoglobin: 9.9 g/dL — ABNORMAL LOW (ref 12.0–15.0)
RBC: 3.52 MIL/uL — ABNORMAL LOW (ref 3.87–5.11)

## 2013-07-05 MED ORDER — PNEUMOCOCCAL VAC POLYVALENT 25 MCG/0.5ML IJ INJ
0.5000 mL | INJECTION | INTRAMUSCULAR | Status: AC
  Administered 2013-07-07: 0.5 mL via INTRAMUSCULAR
  Filled 2013-07-05: qty 0.5

## 2013-07-05 MED ORDER — LABETALOL HCL 100 MG PO TABS
100.0000 mg | ORAL_TABLET | Freq: Two times a day (BID) | ORAL | Status: DC
  Administered 2013-07-05: 100 mg via ORAL
  Filled 2013-07-05 (×2): qty 1

## 2013-07-05 NOTE — Progress Notes (Signed)
Subjective: Postpartum Day 1: Cesarean Delivery (Multiple medical problems: HTN, T2DM, h/o A-Fib)  Patient reports incisional pain, tolerating PO and + flatus.   Ambulating without difficulty.  Vague c/o pain at completion of void.  Denies HA, visual changes or abd pain.  Objective: Vital signs in last 24 hours: Temp:  [98 F (36.7 C)-98.4 F (36.9 C)] 98.4 F (36.9 C) (08/29 0914) Pulse Rate:  [76-96] 96 (08/29 0914) Resp:  [20] 20 (08/29 0914) BP: (122-157)/(78-88) 155/85 mmHg (08/29 0914) SpO2:  [98 %-100 %] 98 % (08/29 0914)   Physical Exam:  General: alert and no distress Lochia: appropriate Uterine Fundus: firm Incision: healing well, dressing intact, JP drain in place DVT Evaluation: No evidence of DVT seen on physical exam. 1-2+ edema, no calf tenderness  Recent Labs  07/05/13 0640  HGB 9.9*  HCT 29.9*    Assessment/Plan: Status post Cesarean section. Postoperative course complicated by elevated BPs.  Will increase labetalol to 100mg  BID.  Pt is asymptomatic.  Will check PCR and labs in am.   Will also send ucx for vague urinary complaints.  Pt will try 2 percocet instead of one for pain relief.  T2DM will cont metformin 1000 bid.  Fasting this morning was good but they need to check CBGs 4x/daily (fasting and 2hrs after meals).  h/o afib on diltiazem and lovenox - will cont.  Continue current care. Pt plans in pt circ and is breast feeding.  She had BTL for contraception.  Purcell Nails 07/05/2013, 4:42 PM

## 2013-07-06 LAB — CBC
HCT: 25.3 % — ABNORMAL LOW (ref 36.0–46.0)
HCT: 28.5 % — ABNORMAL LOW (ref 36.0–46.0)
Hemoglobin: 8.5 g/dL — ABNORMAL LOW (ref 12.0–15.0)
Hemoglobin: 9.4 g/dL — ABNORMAL LOW (ref 12.0–15.0)
MCHC: 33.6 g/dL (ref 30.0–36.0)
MCV: 85.1 fL (ref 78.0–100.0)
RBC: 2.96 MIL/uL — ABNORMAL LOW (ref 3.87–5.11)
RBC: 3.35 MIL/uL — ABNORMAL LOW (ref 3.87–5.11)
WBC: 10 10*3/uL (ref 4.0–10.5)

## 2013-07-06 LAB — COMPREHENSIVE METABOLIC PANEL
Albumin: 2.4 g/dL — ABNORMAL LOW (ref 3.5–5.2)
Alkaline Phosphatase: 76 U/L (ref 39–117)
BUN: 5 mg/dL — ABNORMAL LOW (ref 6–23)
CO2: 22 mEq/L (ref 19–32)
Chloride: 101 mEq/L (ref 96–112)
Creatinine, Ser: 0.46 mg/dL — ABNORMAL LOW (ref 0.50–1.10)
GFR calc Af Amer: >90 mL/min (ref >90)
GFR calc non Af Amer: >90 mL/min (ref >90)
Glucose, Bld: 92 mg/dL (ref 70–99)
Potassium: 3.1 mEq/L — ABNORMAL LOW (ref 3.5–5.1)
Total Bilirubin: 0.2 mg/dL — ABNORMAL LOW (ref 0.3–1.2)

## 2013-07-06 LAB — GLUCOSE, CAPILLARY
Glucose-Capillary: 131 mg/dL — ABNORMAL HIGH (ref 70–99)
Glucose-Capillary: 130 mg/dL — ABNORMAL HIGH (ref 70–99)

## 2013-07-06 LAB — LACTATE DEHYDROGENASE: LDH: 250 U/L (ref 94–250)

## 2013-07-06 LAB — PROTEIN / CREATININE RATIO, URINE: Protein Creatinine Ratio: 0.48 — ABNORMAL HIGH (ref 0.00–0.15)

## 2013-07-06 MED ORDER — MAGNESIUM SULFATE 40 G IN LACTATED RINGERS - SIMPLE
2.0000 g/h | INTRAVENOUS | Status: AC
  Administered 2013-07-06: 2 g/h via INTRAVENOUS
  Filled 2013-07-06: qty 500

## 2013-07-06 MED ORDER — LABETALOL HCL 200 MG PO TABS
400.0000 mg | ORAL_TABLET | Freq: Three times a day (TID) | ORAL | Status: DC
  Administered 2013-07-06 – 2013-07-08 (×7): 400 mg via ORAL
  Filled 2013-07-06 (×7): qty 2

## 2013-07-06 MED ORDER — MAGNESIUM SULFATE BOLUS VIA INFUSION
2.0000 g | Freq: Once | INTRAVENOUS | Status: AC
  Administered 2013-07-06: 2 g via INTRAVENOUS
  Filled 2013-07-06: qty 500

## 2013-07-06 MED ORDER — POTASSIUM CHLORIDE CRYS ER 20 MEQ PO TBCR
20.0000 meq | EXTENDED_RELEASE_TABLET | Freq: Two times a day (BID) | ORAL | Status: DC
  Administered 2013-07-06 – 2013-07-08 (×5): 20 meq via ORAL
  Filled 2013-07-06 (×6): qty 1

## 2013-07-06 MED ORDER — POTASSIUM CHLORIDE CRYS ER 10 MEQ PO TBCR: 10.0000 meq | EXTENDED_RELEASE_TABLET | Freq: Two times a day (BID) | ORAL | Status: DC

## 2013-07-06 NOTE — Progress Notes (Signed)
Patient up and ambulating in hallway, c/o gas pain, drinking warm liquid at this time.

## 2013-07-06 NOTE — Progress Notes (Signed)
Joy Le, mid wife, present.  Assessed incision.  Placed a new honeycomb and a pressure dressing over incision.  JP drain in place and draining well.

## 2013-07-06 NOTE — Progress Notes (Addendum)
Subjective: Postpartum Day 2: Cesarean Delivery Patient reports tolerating PO, + flatus and no problems voiding.  Pt has slight HA and denies visual changes.  No RUQ tenderness.  Objective: Vital signs in last 24 hours: Temp:  [97.7 F (36.5 C)-98 F (36.7 C)] 98 F (36.7 C) (08/30 0557) Pulse Rate:  [75-88] 88 (08/30 0843) Resp:  [18] 18 (08/30 0557) BP: (150-181)/(84-100) 164/100 mmHg (08/30 0843) JP drain 10cc bright red/5.5hrs CBG 88 fasting and 131 2hr after bkfst  Physical Exam:  General: alert and no distress Lochia: appropriate Uterine Fundus: firm Incision: with drainage dark red, incision probed without difficulty and no hematoma noted.  + edema of skin around incision and app tender. DVT Evaluation: No evidence of DVT seen on physical exam.   Recent Labs  07/05/13 0640 07/06/13 0615  HGB 9.9* 9.4*  HCT 29.9* 28.5*   Pr/Cr 0.48 Gest htn Labs wnl Hgb stable  Assessment/Plan: Status post Cesarean section. Postoperative course complicated by Preeclampsia  Will transfer to AICU and start MgSO4. Pt on Ca channel blocker so will do 2g bolus and 2g/hr and observe. Will recheck labs later this evening with Mg level Cont BP meds Will hold lovenox this evening because of drainage from incision and restart once drainage improves.  It is unlikely to resolve while continuing to get the lovenox at that dose. SCDs to bilateral LEs while not on lovenox. UCx not collected about to be done now.  Kriss Ishler Y 07/06/2013, 2:07 PM

## 2013-07-06 NOTE — Progress Notes (Signed)
Spoke to [employer], mid wife, to report dressing on incision was blood soaked and removed.  Incision appears swollen and hard above and edematous below. Placed an abdominal pad until Shellie could come and assess the incision. JP drain is in place and at this time is draining well.

## 2013-07-07 LAB — COMPREHENSIVE METABOLIC PANEL
ALT: 21 U/L (ref 0–35)
Alkaline Phosphatase: 72 U/L (ref 39–117)
BUN: 5 mg/dL — ABNORMAL LOW (ref 6–23)
CO2: 25 mEq/L (ref 19–32)
Chloride: 102 mEq/L (ref 96–112)
GFR calc Af Amer: >90 mL/min (ref >90)
GFR calc non Af Amer: >90 mL/min (ref >90)
Glucose, Bld: 122 mg/dL — ABNORMAL HIGH (ref 70–99)
Potassium: 3.5 mEq/L (ref 3.5–5.1)
Sodium: 137 mEq/L (ref 135–145)
Total Bilirubin: 0.1 mg/dL — ABNORMAL LOW (ref 0.3–1.2)
Total Protein: 5.2 g/dL — ABNORMAL LOW (ref 6.0–8.3)

## 2013-07-07 LAB — GLUCOSE, CAPILLARY
Glucose-Capillary: 101 mg/dL — ABNORMAL HIGH (ref 70–99)
Glucose-Capillary: 112 mg/dL — ABNORMAL HIGH (ref 70–99)

## 2013-07-07 LAB — MAGNESIUM: Magnesium: 3.2 mg/dL — ABNORMAL HIGH (ref 1.5–2.5)

## 2013-07-07 MED ORDER — ACETAMINOPHEN 325 MG PO TABS: 650.0000 mg | ORAL_TABLET | Freq: Four times a day (QID) | ORAL | Status: DC | PRN

## 2013-07-07 MED ORDER — HYDROCHLOROTHIAZIDE 12.5 MG PO CAPS
12.5000 mg | ORAL_CAPSULE | Freq: Every day | ORAL | Status: DC
  Administered 2013-07-07 – 2013-07-08 (×2): 12.5 mg via ORAL
  Filled 2013-07-07 (×2): qty 1

## 2013-07-07 NOTE — Progress Notes (Signed)
Subjective: Postpartum Day 3: Cesarean Delivery Patient reports incisional pain, tolerating PO, + flatus and no problems voiding.  Still had some oozing from incision yesterday.  Pt had incisional pain overnight.  She is concerned because she hasn't had a BM.  She reports slight HA, no visual changes and no abdominal pain.  Pt also asking about swelling in LEs.  Pt said she had fruit at dinner which is why her CBG was high.  Objective: Vital signs in last 24 hours: Temp:  [97.7 F (36.5 C)-98.1 F (36.7 C)] 97.8 F (36.6 C) (08/31 0436) Pulse Rate:  [75-97] 79 (08/31 1022) Resp:  [16-22] 20 (08/31 1022) BP: (117-160)/(51-87) 122/56 mmHg (08/31 1022) SpO2:  [98 %-100 %] 100 % (08/31 1022) Weight:  [127.642 kg (281 lb 6.4 oz)-127.778 kg (281 lb 11.2 oz)] 127.778 kg (281 lb 11.2 oz) (08/31 0822) 2hr after dinner 171 but the others were ok  Physical Exam:  General: alert and no distress Lochia: appropriate Uterine Fundus: firm Incision: honeycomb dressing in place and stained, app tender, NABS DVT Evaluation: No evidence of DVT seen on physical exam. 1+ edema   Recent Labs  07/06/13 0615 07/06/13 2342  HGB 9.4* 8.5*  HCT 28.5* 25.3*    Assessment/Plan: Status post Cesarean section. Postoperative course complicated by Preeclampsia and Bleeding at incision site secondary to lovenox.  Continue current care. Cont to hold Lovenox until bleeding ceases and recheck cbc in am Cont labetalol 400mg  TID, Diltiazem Will start HCTZ Cont metformin Bid secondary to T2DM - modified carb diet  Derrisha Foos Y 07/07/2013, 11:01 AM

## 2013-07-07 NOTE — Lactation Note (Signed)
This note was copied from the chart of [name]. Lactation Consultation Note  Follow up consult with this mom and baby, at 9 % weight loss . Baby is latching well with long feeds, up to 1 hours. Mom was using a cradle hold, but baby was not belly to belly with mom. i showed mom cross cradle hold, and the baby latched well, with strong suckles. Mom will try cross cradle, and if not able, will go back to cradle. The baby is voiding and stooling well.  Momo wants to rent a DEP. iotld mom this can be done tomorrow, at discharge. Mom knows to call lactation after discharge, as needed.  Patient Name: [name] Date: 07/07/2013 Reason for consult: Follow-up assessment   Maternal Data    Feeding Feeding Type: Breast Milk  LATCH Score/Interventions Latch: Grasps breast easily, tongue down, lips flanged, rhythmical sucking. Intervention(s): Adjust position;Assist with latch  Audible Swallowing: Spontaneous and intermittent  Type of Nipple: Everted at rest and after stimulation  Comfort (Breast/Nipple): Soft / non-tender     Hold (Positioning): Assistance needed to correctly position infant at breast and maintain latch. Intervention(s): Breastfeeding basics reviewed;Support Pillows;Position options;Skin to skin  LATCH Score: 9  Lactation Tools Discussed/Used     Consult Status Consult Status: Follow-up Date: 07/08/13 Follow-up type: In-patient    Alfred Levins 07/07/2013, 5:52 PM

## 2013-07-08 LAB — CBC
HCT: 24.2 % — ABNORMAL LOW (ref 36.0–46.0)
MCV: 88 fL (ref 78.0–100.0)
RBC: 2.75 MIL/uL — ABNORMAL LOW (ref 3.87–5.11)
RDW: 15.4 % (ref 11.5–15.5)
WBC: 7.2 10*3/uL (ref 4.0–10.5)

## 2013-07-08 MED ORDER — HYDROCHLOROTHIAZIDE 25 MG PO TABS: 25.0000 mg | ORAL_TABLET | Freq: Every day | ORAL | Status: DC

## 2013-07-08 MED ORDER — LABETALOL HCL 200 MG PO TABS: 400.0000 mg | ORAL_TABLET | Freq: Three times a day (TID) | ORAL | Status: DC

## 2013-07-08 MED ORDER — OXYCODONE-ACETAMINOPHEN 5-325 MG PO TABS: 1.0000 | ORAL_TABLET | ORAL | Status: DC | PRN

## 2013-07-08 MED ORDER — WARFARIN SODIUM 5 MG PO TABS: 5.0000 mg | ORAL_TABLET | Freq: Every day | ORAL | Status: DC

## 2013-07-08 NOTE — Progress Notes (Signed)
Subjective: Postpartum Day 4: Cesarean Delivery Patient reports tolerating PO, + flatus and no problems voiding.    Objective: Vital signs in last 24 hours: Temp:  [97.7 F (36.5 C)-98.7 F (37.1 C)] 98.3 F (36.8 C) (09/01 0800) Pulse Rate:  [76-85] 84 (09/01 1200) Resp:  [16-20] 20 (09/01 1200) BP: (129-150)/(53-77) 136/73 mmHg (09/01 1200) SpO2:  [98 %-100 %] 100 % (09/01 1200) Weight:  [283 lb 8 oz (128.595 kg)] 283 lb 8 oz (128.595 kg) (09/01 0953)  Physical Exam:  General: alert and cooperative Lochia: appropriate Uterine Fundus: firm Incision: healing well, no dehiscence, bandage is clean and dry DVT Evaluation: No evidence of DVT seen on physical exam.   Recent Labs  07/06/13 2342 07/08/13 0530  HGB 8.5* 7.8*  HCT 25.3* 24.2*    Assessment/Plan: Status post Cesarean section. Postoperative course complicated by preclampsia.  pt s/p magnesuim and doing well. will wait until I hear from cardiology for insturcitons on lovenox.  it is held now because  of the oozing from the incision  Continue current care.  Joy Le A 07/08/2013, 12:19 PM

## 2013-07-08 NOTE — Progress Notes (Signed)
Patient called RN to room to report that her incision was oozing and soiling her panties.  Incision noted with Honeycomb dressing that was rolling on edges and saturated with blood tinged fluid.  Dressing removed and replaced with abdominal pad dressing.  Honeycomb dressings had been changed yesterday x 2.  Will continue to monitor incision and dressing for oozing/leakage.

## 2013-07-08 NOTE — Progress Notes (Signed)
Patient given discharge instructions and questions answered. Taught patient and her mother how to properly empty and recharge JP drain and obtain accurate measurements.

## 2013-07-08 NOTE — Discharge Summary (Signed)
Obstetric Discharge Summary Reason for Admission: cesarean section Prenatal Procedures: NST and ultrasound Intrapartum Procedures: cesarean: low cervical, transverse Postpartum Procedures: anticoagulation Complications-Operative and Postpartum: wound drainage Hemoglobin  Date Value Range Status  07/08/2013 7.8* 12.0 - 15.0 g/dL Final     HCT  Date Value Range Status  07/08/2013 24.2* 36.0 - 46.0 % Final    Physical Exam:  General: alert and cooperative Lochia: appropriate Uterine Fundus: firm Incision: no significant drainage, bandage CDI DVT Evaluation: No evidence of DVT seen on physical exam. CV RRR Lungs CTA B  Hospital Course:   The patient came in labor and had Le CS for Previous C/S, Desire for Sterilzation, Hypertension, Diabetes; CPT 59510, [phone] by Dr Wendelyn Breslow.  Post operatively she did well.  She tolerated Le regular diet and her exam is WNL and documented in the chart. .  She has recovered well and is ready for discharge.  She is bottle feeding and will use BTL for [facility]. CHTN with superimposed preeclampsia.  Pt received magnesium for seizure prophylaxisis.  She  Will be discharged home on labetalol  DM pt discharged on metformin.  BS are well controlled AFIB pt will restart her lovenox and coumadin per Cardiologist I spoke with today.  She plans to bottle feed.  She will stop the lovenox once she is theraputic on the coumadin. She will follow up with both [facility] and [facility] on this Thursday.  She was instructed to call if oozing from incision restarts.  We can remove her JP drain on Thursday.       Discharge Diagnoses: Term Pregnancy-delivered and Preelampsia  Discharge Information: Date: 07/08/2013 Activity: pelvic rest Diet: routine and ADA diabetic diet Medications: PNV, Percocet and coumadin and lovenox Condition: stable Instructions: refer to practice specific booklet Discharge to: home Follow-up Information   Follow up with [facility].  Schedule an appointment as soon as possible for Le visit in 3 days. (Also follow up with [facility] on the same day)    Specialty:  Obstetrics and Gynecology   Contact information:   [address] [place] [address] [phone]      Newborn Data: Live born female  Birth Weight: 7 lb 9.3 oz (3440 g) APGAR: 7, 9  Home with mother.  Joy Le 07/08/2013, 12:44 PM

## 2013-07-08 NOTE — Progress Notes (Signed)
UR chart review completed.  

## 2013-07-09 ENCOUNTER — Encounter (HOSPITAL_COMMUNITY)
Admission: RE | Admit: 2013-07-09 | Discharge: 2013-07-09 | Disposition: A | Discharge status: Home / Self Care | Payer: BC Managed Care – PPO | Source: Ambulatory Visit | Attending: Pediatrics | Admitting: Pediatrics

## 2013-07-09 DIAGNOSIS — O923 Agalactia: Secondary | ICD-10-CM | POA: Insufficient documentation

## 2013-07-11 ENCOUNTER — Ambulatory Visit: Payer: BC Managed Care – PPO | Admitting: Pharmacist Clinician (PhC)/ Clinical Pharmacy Specialist

## 2013-08-09 ENCOUNTER — Encounter (HOSPITAL_COMMUNITY)
Admission: RE | Admit: 2013-08-09 | Discharge: 2013-08-09 | Disposition: A | Discharge status: Home / Self Care | Payer: BC Managed Care – PPO | Source: Ambulatory Visit | Attending: Pediatrics | Admitting: Pediatrics

## 2013-08-09 DIAGNOSIS — O923 Agalactia: Secondary | ICD-10-CM | POA: Insufficient documentation

## 2013-09-23 ENCOUNTER — Other Ambulatory Visit: Payer: Self-pay | Admitting: *Deleted

## 2013-09-23 MED ORDER — METOPROLOL SUCCINATE ER 50 MG PO TB24: 50.0000 mg | ORAL_TABLET | Freq: Every day | ORAL | Status: DC

## 2013-09-23 NOTE — Telephone Encounter (Signed)
Refilled toprol to [facility].

## 2013-11-18 ENCOUNTER — Other Ambulatory Visit: Payer: Self-pay | Admitting: Cardiovascular Disease

## 2013-11-20 ENCOUNTER — Other Ambulatory Visit: Payer: Self-pay | Admitting: Cardiovascular Disease

## 2013-11-21 NOTE — Telephone Encounter (Signed)
Pt returned call, LMOM - states she was switched back to metoprolol after birth of her son.  Has 1 tablet left

## 2013-11-21 NOTE — Telephone Encounter (Signed)
LMOM for patient, need to be sure she has stopped labetolol when switching back to metoprolol.

## 2013-11-21 NOTE — Telephone Encounter (Signed)
Returning call.

## 2014-04-12 ENCOUNTER — Other Ambulatory Visit: Payer: Self-pay | Admitting: Cardiovascular Disease

## 2014-05-26 ENCOUNTER — Other Ambulatory Visit: Payer: Self-pay | Admitting: Cardiovascular Disease

## 2014-05-27 NOTE — Telephone Encounter (Signed)
Rx was sent to pharmacy electronically. 

## 2014-06-08 ENCOUNTER — Other Ambulatory Visit: Payer: Self-pay | Admitting: Cardiovascular Disease

## 2014-06-09 NOTE — Telephone Encounter (Signed)
Rx refill sent to patient pharmacy with note to make appointment

## 2014-06-15 ENCOUNTER — Other Ambulatory Visit: Payer: Self-pay | Admitting: Cardiovascular Disease

## 2014-06-25 ENCOUNTER — Other Ambulatory Visit: Payer: Self-pay | Admitting: Cardiovascular Disease

## 2014-06-26 NOTE — Telephone Encounter (Signed)
Rx was sent to pharmacy electronically. 

## 2014-06-30 ENCOUNTER — Encounter: Payer: Self-pay | Admitting: Cardiovascular Disease

## 2014-06-30 ENCOUNTER — Telehealth: Payer: Self-pay | Admitting: Cardiovascular Disease

## 2014-06-30 NOTE — Telephone Encounter (Signed)
Closed enocunter °

## 2014-07-11 ENCOUNTER — Other Ambulatory Visit: Payer: Self-pay | Admitting: Cardiovascular Disease

## 2014-07-11 NOTE — Telephone Encounter (Signed)
Rx refill sent to patient pharmacy   

## 2014-07-17 ENCOUNTER — Other Ambulatory Visit: Payer: Self-pay | Admitting: Cardiovascular Disease

## 2014-07-21 IMAGING — US US RENAL
1 series · 14 of 25 positions shown · non-contrast
Comparison: None.

CLINICAL DATA: UTI.  History kidney stones and hematuria.  22 weeks
pregnant.

RENAL/URINARY TRACT ULTRASOUND COMPLETE

[Series 1: us renal · 14 of 42 slices shown]
[im 1/42]
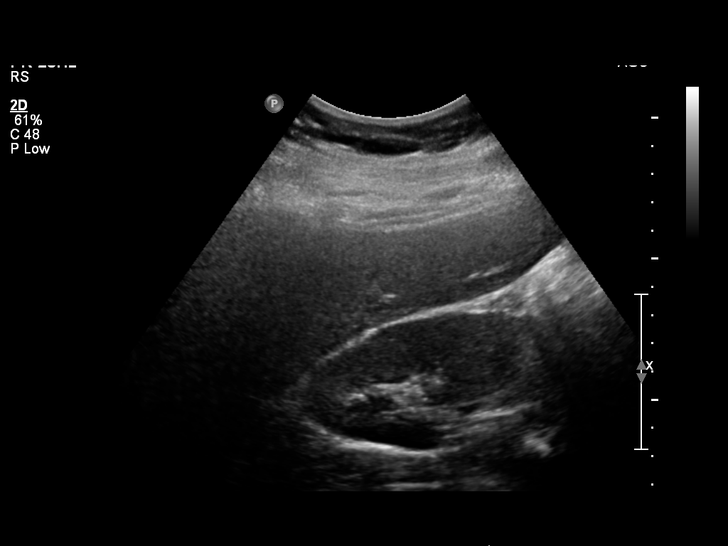
[im 4/42]
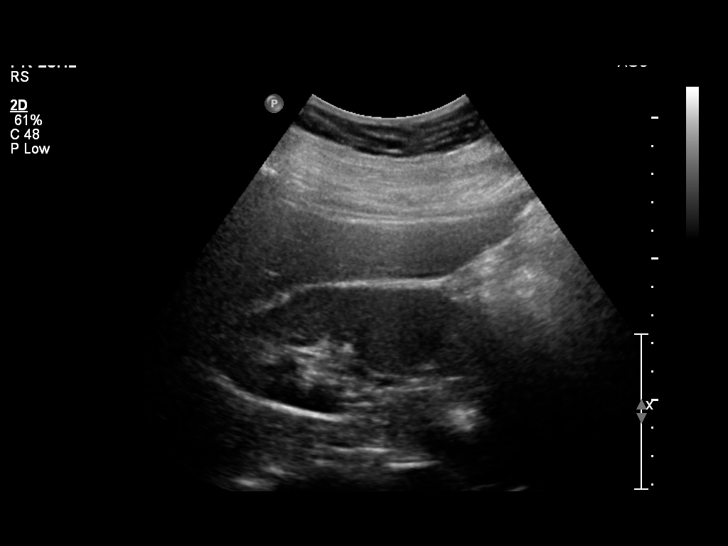
[im 7/42]
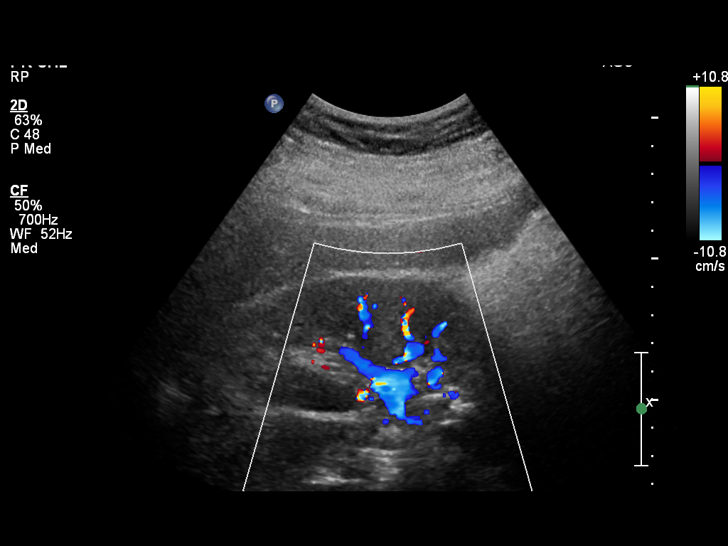
[im 11/42]
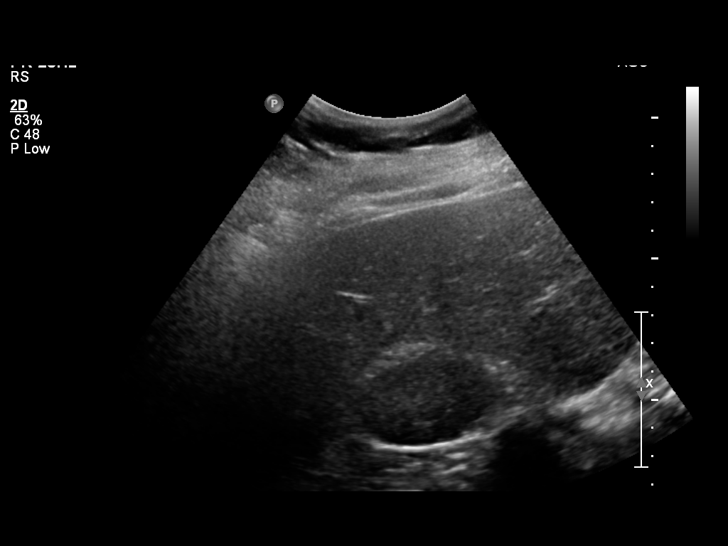
[im 14/42]
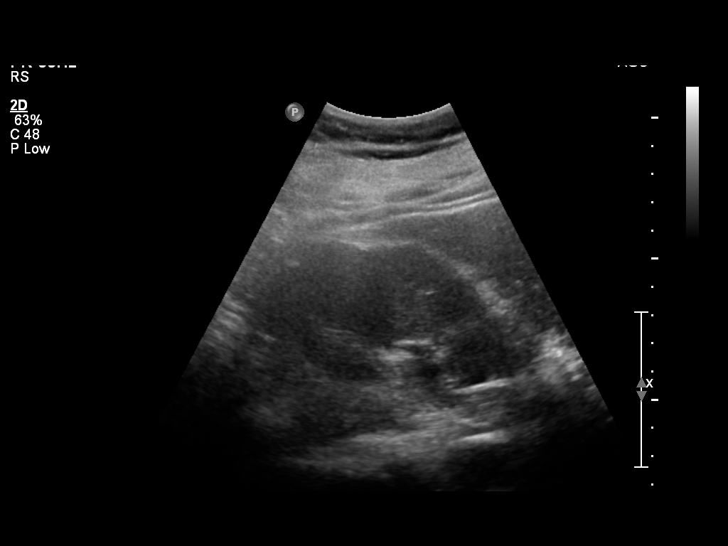
[im 16/42]
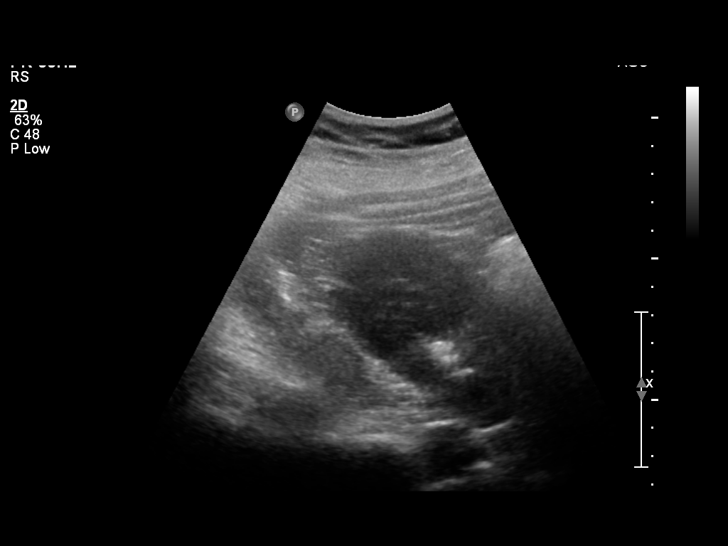
[im 19/42]
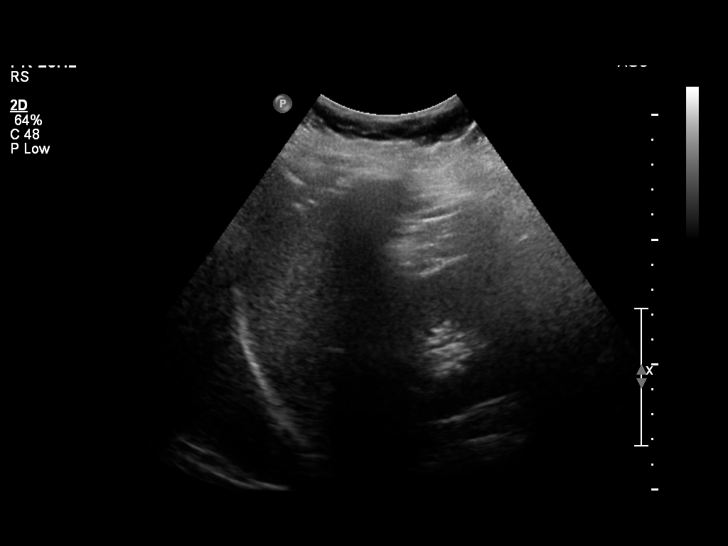
[im 23/42]
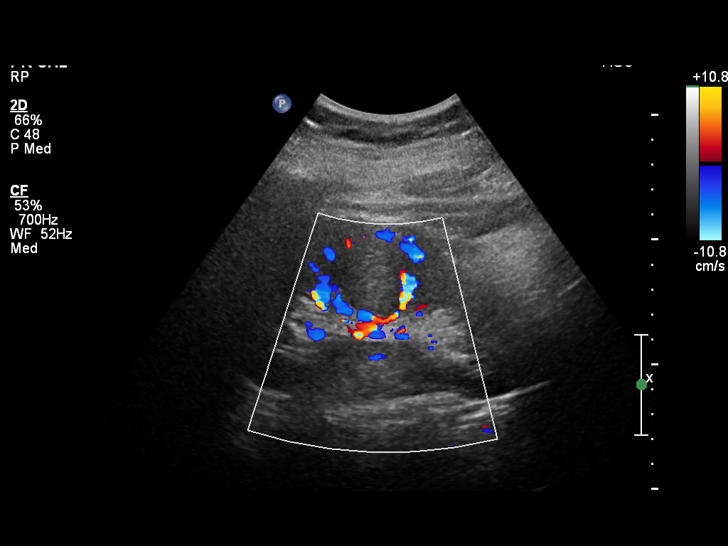
[im 26/42]
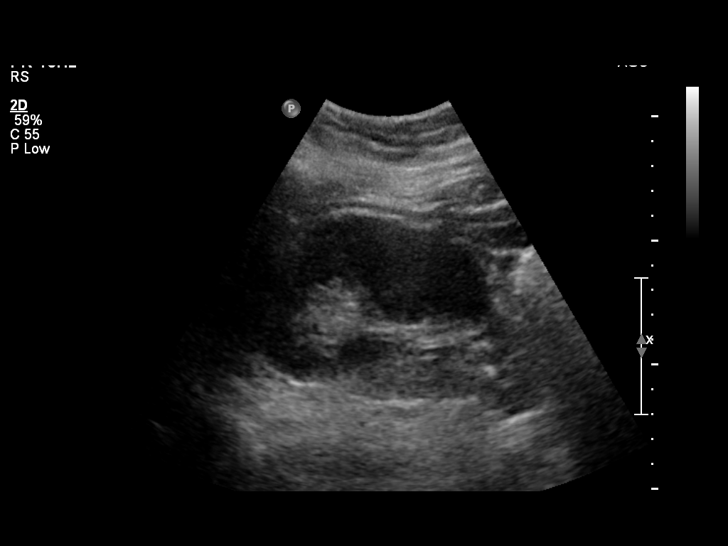
[im 28/42]
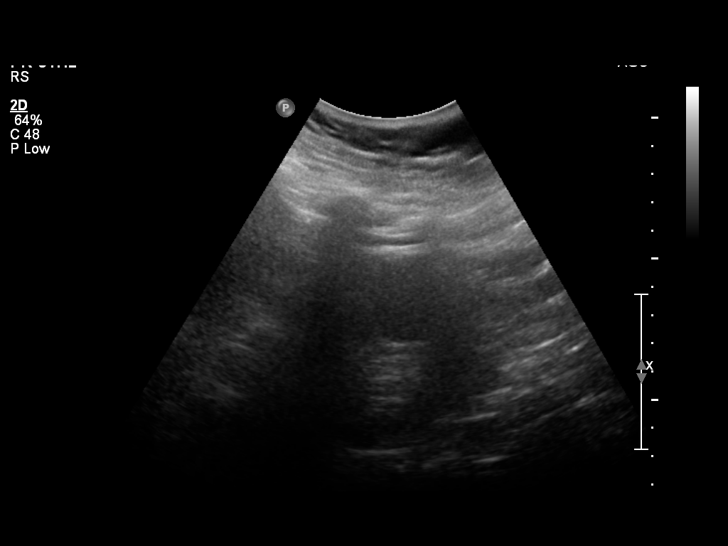
[im 31/42]
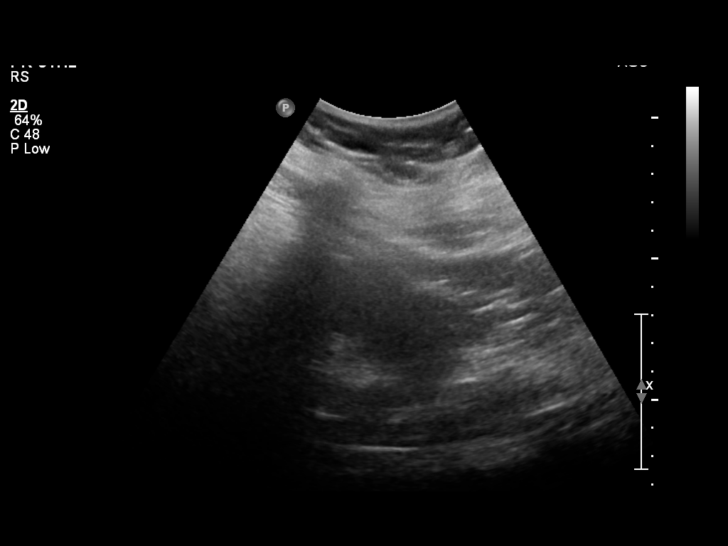
[im 35/42]
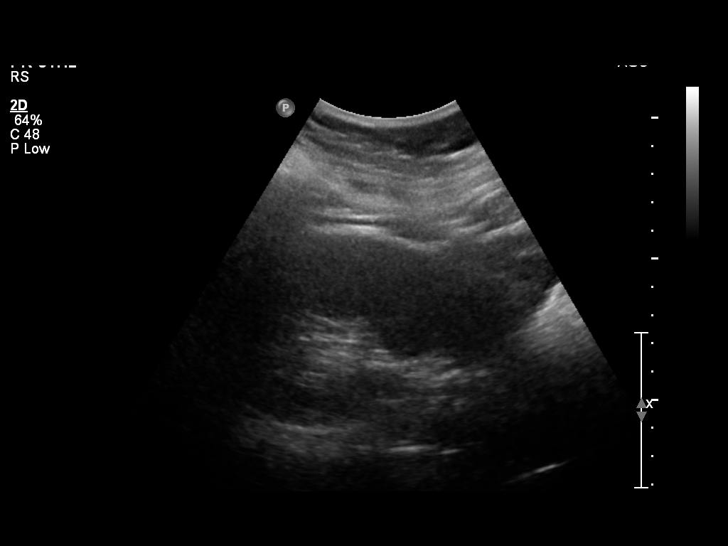
[im 38/42]
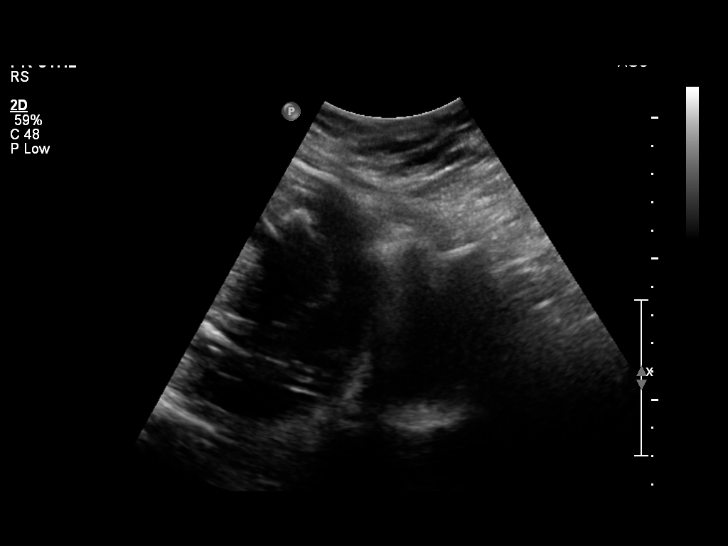
[im 42/42]
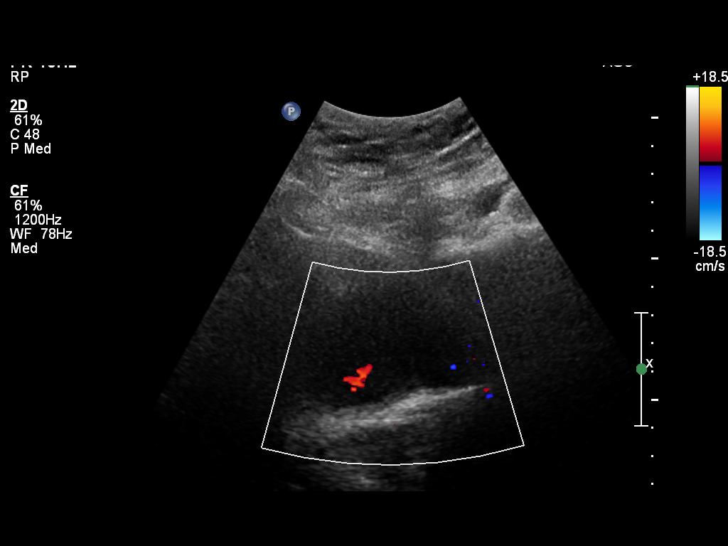

[14 of 25 positions shown; findings below may reference images not displayed]

FINDINGS: Right Kidney:  13.0 cm in length.  Negative for obstruction.  No
renal abscess or mass.  No kidney stones.

Left Kidney:  12.4 cm in length.  Negative for obstruction.  No
renal abscess.  No kidney stone.

Bladder:  Incompletely distended bladder without abnormality.
Bilateral ureteral jets are documented.
IMPRESSION: Negative

## 2014-07-30 ENCOUNTER — Ambulatory Visit: Payer: BC Managed Care – PPO | Admitting: Cardiovascular Disease

## 2014-08-13 ENCOUNTER — Other Ambulatory Visit: Payer: Self-pay | Admitting: Cardiovascular Disease

## 2014-08-13 NOTE — Telephone Encounter (Signed)
Rx was sent to pharmacy electronically. OV 10/14 with Dr. Royann Shiversroitoru

## 2014-08-20 ENCOUNTER — Encounter: Payer: Self-pay | Admitting: Cardiovascular Disease

## 2014-08-20 ENCOUNTER — Ambulatory Visit (INDEPENDENT_AMBULATORY_CARE_PROVIDER_SITE_OTHER): Payer: BC Managed Care – PPO | Admitting: Cardiovascular Disease

## 2014-08-20 VITALS — BP 130/68 | HR 69 | Resp 20 | Ht 62.0 in | Wt 269.0 lb

## 2014-08-20 DIAGNOSIS — I10 Essential (primary) hypertension: Secondary | ICD-10-CM

## 2014-08-20 DIAGNOSIS — Q211 Atrial septal defect, unspecified: Secondary | ICD-10-CM

## 2014-08-20 DIAGNOSIS — I480 Paroxysmal atrial fibrillation: Secondary | ICD-10-CM

## 2014-08-20 DIAGNOSIS — Q233 Congenital mitral insufficiency: Secondary | ICD-10-CM

## 2014-08-20 NOTE — Patient Instructions (Signed)
Dr.Croitoru wants you to follow-up in: 1 year. You will receive a reminder letter in the mail two months in advance. If you don't receive a letter, please call our office to schedule the follow-up appointment.  

## 2014-08-24 ENCOUNTER — Encounter: Payer: Self-pay | Admitting: Cardiovascular Disease

## 2014-08-24 NOTE — Progress Notes (Signed)
Patient ID: Joy Le, female   DOB: 02/01/1976, 38 y.o.   MRN: [id number]     Reason for office visit History of congenital heart disease, mitral insufficiency, atrial fibrillation   1 year has passed since Joy Le successfully completed her second pregnancy, notwithstanding the problems related to necessary anticoagulation. Since her last appointment she has managed to lose almost 15 pounds of weight, but remains morbidly obese with a BMI of 49. She feels great, denying any problems with palpitations, syncope, shortness of breath or lower extremity edema.   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 She has normal left ventricular size and systolic function. By echo both atrial are actually described as normal in size. She has had problems with paroxysmal atrial fibrillation in the past. She is therapeutically anticoagulated with a novel agent and has not had bleeding complications, TIA, stroke or other embolic events.  As part of her problems with obesity she has type 2 diabetes mellitus and systemic hypertension. Her blood pressure has improved remarkably as she has lost weight. She is no longer taking a diuretic.  Allergies  Allergen Reactions  . Codeine Other (See Comments)    Hallucination    Current Outpatient Prescriptions  Medication Sig Dispense Refill  . CARTIA XT 120 MG 24 hr capsule TAKE ONE CAPSULE BY MOUTH ONCE DAILY  30 capsule  0  . CINNAMON PO Take 2 capsules by mouth daily.       . cyanocobalamin 1000 MCG tablet Take 1,000 mcg by mouth daily.      Marland Kitchen. enoxaparin (LOVENOX) 120 MG/0.8ML injection Inject 0.8 mLs (120 mg total) into the skin every 12 (twelve) hours.  60 Syringe  3  . hydrochlorothiazide (HYDRODIURIL) 25 MG tablet Take 1 tablet (25 mg total) by mouth daily.  7 tablet  0  . labetalol (NORMODYNE) 200 MG tablet Take 2 tablets (400 mg total) by mouth 3  (three) times daily.  90 tablet  1  . metFORMIN (GLUCOPHAGE) 500 MG tablet Take 1,000 mg by mouth 2 (two) times daily with a meal.       . metoprolol succinate (TOPROL-XL) 50 MG 24 hr tablet Take 1 tablet (50 mg total) by mouth daily. <please schedule appointment for refills>  30 tablet  1  . Multiple Vitamins-Minerals (MULTIVITAMIN WITH MINERALS) tablet Take 1 tablet by mouth daily.      . Potassium Gluconate 595 MG TBCR Take 595 mg by mouth daily.      Joy Le. XARELTO 20 MG TABS tablet TAKE ONE TABLET BY MOUTH ONCE DAILY WITH  SUPPER  30 tablet  3   No current facility-administered medications for this visit.    Past Medical History  Diagnosis Date  . Resistance to insulin   . ASD (atrial septal defect)     ASD repair @ age 38 years old  . Irregular menstrual cycle   . Cystitis   . Diabetes mellitus     Type I GDM  . Hypertension     Chronic HTN  . H/O: obesity   . H/O menorrhagia 04/28/04  . Oligomenorrhea 04/28/04  . Irregular menses 10/27/06  . Congenital heart defect     Atrial ventricular defect  . Gestational diabetes 2008  . Cleft leaflet, mitral valve     anterior  . Systemic hypertension   . Heart murmur     at birth -  yearly check ups  . Asthma     No longer has - had only with 1st preg 2008  . Headache(784.0)     otc meds prn  . Atrial flutter     x 2 - a fib    Past Surgical History  Procedure Laterality Date  . Wisdom tooth extraction    . Cesarean section  2008  . Asd repair  1984    [facility]  . Cardioversion  01/25/2011    successful  . Cardiac catheterization  04/27/2005    No sign. CAD,mod. MR,elevated LV end-diastolic pressure,EF 50% w/mild anterolateral hypokinesis  . US echocardiography  03/29/2011    mild to mod MR, mild TR,EF 60-65%  . Cesarean section with bilateral tubal ligation Bilateral 07/04/2013    Procedure: REPEAT CESAREAN SECTION WITH BILATERAL TUBAL LIGATION;  Surgeon: Kirkland Hun, MD;  Location: [facility];  Service: Obstetrics;  Laterality:  Bilateral;    Family History  Problem Relation Age of Onset  . Diabetes Mother   . Stroke Mother   . Hypertension Mother   . Hypertension Father   . Anemia Sister   . Diabetes Maternal Aunt   . Diabetes Paternal Aunt   . Diabetes Maternal Grandmother   . Lung cancer Paternal Grandfather   . Alcohol abuse Paternal Grandfather   . Alzheimer's disease Maternal Grandfather   . Hypertension Sister     History   Social History  . Marital Status: Married    Spouse Name: Joy Jolly "BJ"    Number of Children: 1  . Years of Education: 20   Occupational History  . Teacher [employer]   Social History Main Topics  . Smoking status: Never Smoker   . Smokeless tobacco: Never Used  . Alcohol Use: Yes     Comment: occasionally prior to pregnancy  . Drug Use: No  . Sexual Activity: Yes    Partners: Male    Birth Control/ Protection: None, Sponge     Comment: pregnant   Other Topics Concern  . Not on file   Social History Narrative  . No narrative on file    Review of systems: The patient specifically denies any chest pain at rest or with exertion, dyspnea at rest or with exertion, orthopnea, paroxysmal nocturnal dyspnea, syncope, palpitations, focal neurological deficits, intermittent claudication, lower extremity edema, unexplained weight gain, cough, hemoptysis or wheezing.  The patient also denies abdominal pain, nausea, vomiting, dysphagia, diarrhea, constipation, polyuria, polydipsia, dysuria, hematuria, frequency, urgency, abnormal bleeding or bruising, fever, chills, unexpected weight changes, mood swings, change in skin or hair texture, change in voice quality, auditory or visual problems, allergic reactions or rashes, new musculoskeletal complaints other than usual "aches and pains".   PHYSICAL EXAM BP 130/68  Pulse 69  Resp 20  Ht 5\' 2"  (1.575 m)  Wt 122.018 kg (269 lb)  BMI 49.19 kg/m2 General: Alert, oriented x3, no distress; pregnancy  Head:  no evidence of trauma, PERRL, EOMI, no exophtalmos or lid lag, no myxedema, no xanthelasma; normal ears, nose and oropharynx  Neck: normal jugular venous pulsations and no hepatojugular reflux; brisk carotid pulses without delay and no carotid bruits  Chest: clear to auscultation, no signs of consolidation by percussion or palpation, normal fremitus, symmetrical and full respiratory excursions  Cardiovascular: normal position and quality of the apical impulse, regular rhythm, normal first and second heart sounds, 1/6 holosystolic apical murmur, no rumble, rubs or gallops  Abdomen: no tenderness or distention, no masses by palpation,  no abnormal pulsatility or arterial bruits, normal bowel sounds, no hepatosplenomegaly  Extremities: no clubbing, cyanosis or edema; 2+ radial, ulnar and brachial pulses bilaterally; 2+ right femoral, posterior tibial and dorsalis pedis pulses; 2+ left femoral, posterior tibial and dorsalis pedis pulses; no subclavian or femoral bruits  Neurological: grossly nonfocal   EKG: Normal sinus rhythm, first degree AV block (PR 284 ms), left atrial abnormality, QTC 413 ms   BMET    Component Value Date/Time   NA 137 07/06/2013 2342   K 3.5 07/06/2013 2342   CL 102 07/06/2013 2342   CO2 25 07/06/2013 2342   GLUCOSE 122* 07/06/2013 2342   BUN 5* 07/06/2013 2342   CREATININE 0.51 07/06/2013 2342   CREATININE 0.50 12/17/2012 0858   CALCIUM 8.4 07/06/2013 2342   GFRNONAA >90 07/06/2013 2342   GFRAA >90 07/06/2013 2342     ASSESSMENT AND PLAN  Atrial fibrillation  No clinically evident recurrence recently. She takes anti-coagulation therapy with Xarelto.   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 There is no evidence of residual ASD but she does have mild to moderate mitral insufficiency. She has preserved left ventricular systolic size and function. By echocardiography the left atrium and  right atrium were both described as being normal in size. She does not have signs symptoms or echo evidence of significant pulmonary hypertension. She has tolerated term pregnancy without any symptoms.   Mitral valve insufficiency, congenital  As there is no prosthetic valve involved, by current guidelines she does not require antibiotic prophylaxis for dental procedures. I would recommend antibiotic prophylaxis for gynecological nonsterile procedures.  This is asymptomatic. At one point the mitral insufficiency was estimated to be moderate to severe but by her most recent echo performed in May of 2012 of those only mild to moderate mitral insufficiency.   Type 2 diabetes mellitus  Currently on Metformin 500 mg po BID, continue as this may help with weight loss    Meds ordered this encounter  Medications  . Potassium Gluconate 595 MG TBCR    Sig: Take 595 mg by mouth daily.  . Multiple Vitamins-Minerals (MULTIVITAMIN WITH MINERALS) tablet    Sig: Take 1 tablet by mouth daily.    Junious SilkROITORU,Joy Le  Joy Kerce, MD, [facility] HospitalFACC [facility] [phone] office [phone] pager

## 2014-08-26 ENCOUNTER — Other Ambulatory Visit: Payer: Self-pay | Admitting: Cardiovascular Disease

## 2014-08-26 NOTE — Telephone Encounter (Signed)
Rx was sent to pharmacy electronically. 

## 2014-09-08 ENCOUNTER — Encounter: Payer: Self-pay | Admitting: Cardiovascular Disease

## 2014-09-12 ENCOUNTER — Other Ambulatory Visit: Payer: Self-pay | Admitting: Cardiovascular Disease

## 2014-09-12 NOTE — Telephone Encounter (Signed)
E sent to pharmacy 

## 2014-11-27 ENCOUNTER — Other Ambulatory Visit: Payer: Self-pay | Admitting: Cardiovascular Disease

## 2015-04-13 ENCOUNTER — Other Ambulatory Visit: Payer: Self-pay | Admitting: Cardiovascular Disease

## 2015-04-13 NOTE — Telephone Encounter (Signed)
Rx has been sent to the pharmacy electronically. ° °

## 2015-04-20 ENCOUNTER — Telehealth: Payer: Self-pay | Admitting: Cardiovascular Disease

## 2015-04-20 NOTE — Telephone Encounter (Signed)
Pt informs me she is on Xarelto, already taken dose today - advised probably need advanced notice for change/discontinuation of anticoagulant medications.  Advised her to inform dentist at her appt tomorrow that she is on Xarelto, if bleed risk OK from dentist's standpoint, probably fine to proceed - o/w if procedure rescheduled, for her inform us in advance so pharmD can give med instructions.  Pt voiced understanding w/ given instructions.

## 2015-04-20 NOTE — Telephone Encounter (Signed)
Pt called in stating that she will be having a root canal done tomorrow at Dr. Eula Flax office and she would like to know if she needs to stop her blood thinner prior to going in for this procedure. Please call back  Thanks

## 2015-06-08 ENCOUNTER — Other Ambulatory Visit: Payer: Self-pay | Admitting: Cardiovascular Disease

## 2015-06-10 ENCOUNTER — Other Ambulatory Visit: Payer: Self-pay | Admitting: Cardiovascular Disease

## 2015-06-10 MED ORDER — RIVAROXABAN 20 MG PO TABS: 20.0000 mg | ORAL_TABLET | Freq: Every day | ORAL | Status: DC

## 2015-06-10 NOTE — Telephone Encounter (Signed)
Rx(s) sent to pharmacy electronically. lmtcb.

## 2015-06-10 NOTE — Telephone Encounter (Signed)
°  1. Which medications need to be refilled? Xarelto  2. Which pharmacy is medication to be sent to?[id number] 3. They need a 30 day or 90 day supply? 30 and refills  4. Would they like a call back once the medication has been sent to the pharmacy? yes

## 2015-06-11 NOTE — Telephone Encounter (Signed)
Patient notified

## 2015-08-17 ENCOUNTER — Other Ambulatory Visit: Payer: Self-pay | Admitting: Cardiovascular Disease

## 2015-09-23 ENCOUNTER — Other Ambulatory Visit: Payer: Self-pay | Admitting: Cardiovascular Disease

## 2015-09-23 NOTE — Telephone Encounter (Signed)
Rx(s) sent to pharmacy electronically.  

## 2015-10-25 ENCOUNTER — Other Ambulatory Visit: Payer: Self-pay | Admitting: Cardiovascular Disease

## 2015-10-27 NOTE — Telephone Encounter (Signed)
Rx(s) sent to pharmacy electronically.  

## 2015-12-07 ENCOUNTER — Encounter: Payer: BC Managed Care – PPO | Admitting: Cardiovascular Disease

## 2015-12-08 NOTE — Progress Notes (Signed)
This encounter was created in error - please disregard.

## 2015-12-11 ENCOUNTER — Telehealth: Payer: Self-pay | Admitting: Cardiovascular Disease

## 2015-12-11 ENCOUNTER — Encounter: Payer: Self-pay | Admitting: Cardiovascular Disease

## 2015-12-11 NOTE — Telephone Encounter (Signed)
Request for surgical clearance:  1. What type of surgery is being performed? Kidney Stone removal    2. When is this surgery scheduled? Pending, (pt mentioned in the next 3 wks)   3. Are there any medications that need to be held prior to surgery and how long? Xarelto,(did not state how long, based on Cardiologist recommendation)  4. Name of physician performing surgery? Dr. Marlou Porch [facility]   5. What is your office phone and fax number? [phone])

## 2015-12-11 NOTE — Telephone Encounter (Signed)
Sent via epic 

## 2015-12-14 ENCOUNTER — Other Ambulatory Visit: Payer: Self-pay | Admitting: Urology

## 2015-12-14 ENCOUNTER — Telehealth: Payer: Self-pay | Admitting: Cardiovascular Disease

## 2015-12-14 NOTE — Telephone Encounter (Signed)
Received records from [facility] for appointment on 01/06/16 with Dr Royann Shivers.  Records given to [facility] (medical records) for Dr Croitoru's schedule on 01/06/16. lp

## 2015-12-15 ENCOUNTER — Telehealth: Payer: Self-pay | Admitting: Cardiovascular Disease

## 2015-12-15 NOTE — Telephone Encounter (Signed)
New message  ° ° °Patient calling back to speak with nurse  °

## 2015-12-15 NOTE — Telephone Encounter (Signed)
Patient says she can't wait until 3/1 with her appointment with Dr. Royann Shivers because she needs to stop her medications earlier to be ready for procedure on 3/3

## 2015-12-15 NOTE — Telephone Encounter (Signed)
In patients with normal renal function, the effects of Xarelto essentially disappear 48 hours after the last dose. She would take the last dose of Xarelto on the evening of 01/05/16 and I will see her the next day. By the time of her procedure she will have been off the medication for about 60 hours.  I do not have any labs on her since 2014! Has she had a recent BMET and CBC? If not we need to order them now, please.

## 2015-12-16 NOTE — Telephone Encounter (Signed)
Patient is going to get a copy of her most recent labs from her PCP and bring over or have them fax [place] a copy.   Patient understands her instructions for Xarelto.  Will take her last dose on the evening of 2/28 and see Dr. Royann Shivers 3/1

## 2016-01-05 NOTE — Patient Instructions (Signed)
Joy Le  01/05/2016   Your procedure is scheduled on: 01/08/2016    Report to [facility] Main  Entrance take [place]  elevators to 3rd floor to  Short Stay Center at     0845 AM.  Call this number if you have problems the morning of surgery [phone]   Remember: ONLY 1 PERSON MAY GO WITH YOU TO SHORT STAY TO GET  READY MORNING OF YOUR SURGERY.  Do not eat food or drink liquids :After Midnight.  Eat a good healthy snack prior to bedtime.    Take these medicines the morning of surgery with A SIP OF WATER:  Diltiazem ( Cartia), metoprolol ( Toprol) DO NOT TAKE ANY DIABETIC MEDICATIONS DAY OF YOUR SURGERY                               You may not have any metal on your body including hair pins and              piercings  Do not wear jewelry, make-up, lotions, powders or perfumes, deodorant             Do not wear nail polish.  Do not shave  48 hours prior to surgery.          Do not bring valuables to the hospital. [facility] IS NOT             RESPONSIBLE   FOR VALUABLES.  Contacts, dentures or bridgework may not be worn into surgery.      Patients discharged the day of surgery will not be allowed to drive home.  Name and phone number of your driver:  Special Instructions: coughing and deep breathing exercises, leg exercises               Please read over the following fact sheets you were given: _____________________________________________________________________             [facility] - Preparing for Surgery Before surgery, you can play an important role.  Because skin is not sterile, your skin needs to be as free of germs as possible.  You can reduce the number of germs on your skin by washing with CHG (chlorahexidine gluconate) soap before surgery.  CHG is an antiseptic cleaner which kills germs and bonds with the skin to continue killing germs even after washing. Please DO NOT use if you have an allergy to CHG or antibacterial soaps.  If  your skin becomes reddened/irritated stop using the CHG and inform your nurse when you arrive at Short Stay. Do not shave (including legs and underarms) for at least 48 hours prior to the first CHG shower.  You may shave your face/neck. Please follow these instructions carefully:  1.  Shower with CHG Soap the night before surgery and the  morning of Surgery.  2.  If you choose to wash your hair, wash your hair first as usual with your  normal  shampoo.  3.  After you shampoo, rinse your hair and body thoroughly to remove the  shampoo.                           4.  Use CHG as you would any other liquid soap.  You can apply chg directly  to the skin and wash  Gently with a scrungie or clean washcloth.  5.  Apply the CHG Soap to your body ONLY FROM THE NECK DOWN.   Do not use on face/ open                           Wound or open sores. Avoid contact with eyes, ears mouth and genitals (private parts).                       Wash face,  Genitals (private parts) with your normal soap.             6.  Wash thoroughly, paying special attention to the area where your surgery  will be performed.  7.  Thoroughly rinse your body with warm water from the neck down.  8.  DO NOT shower/wash with your normal soap after using and rinsing off  the CHG Soap.                9.  Pat yourself dry with a clean towel.            10.  Wear clean pajamas.            11.  Place clean sheets on your bed the night of your first shower and do not  sleep with pets. Day of Surgery : Do not apply any lotions/deodorants the morning of surgery.  Please wear clean clothes to the hospital/surgery center.  FAILURE TO FOLLOW THESE INSTRUCTIONS MAY RESULT IN THE CANCELLATION OF YOUR SURGERY PATIENT SIGNATURE_________________________________  NURSE SIGNATURE__________________________________  ________________________________________________________________________

## 2016-01-06 ENCOUNTER — Ambulatory Visit (INDEPENDENT_AMBULATORY_CARE_PROVIDER_SITE_OTHER): Payer: BC Managed Care – PPO | Admitting: Cardiovascular Disease

## 2016-01-06 ENCOUNTER — Encounter (HOSPITAL_COMMUNITY): Payer: Self-pay

## 2016-01-06 ENCOUNTER — Encounter: Payer: Self-pay | Admitting: Cardiovascular Disease

## 2016-01-06 ENCOUNTER — Encounter (HOSPITAL_COMMUNITY)
Admission: RE | Admit: 2016-01-06 | Discharge: 2016-01-06 | Disposition: A | Discharge status: Home / Self Care | Payer: BC Managed Care – PPO | Source: Ambulatory Visit | Attending: Urology | Admitting: Urology

## 2016-01-06 VITALS — BP 124/62 | HR 72 | Ht 61.5 in | Wt 261.5 lb

## 2016-01-06 DIAGNOSIS — E119 Type 2 diabetes mellitus without complications: Secondary | ICD-10-CM | POA: Diagnosis not present

## 2016-01-06 DIAGNOSIS — Q233 Congenital mitral insufficiency: Secondary | ICD-10-CM | POA: Diagnosis not present

## 2016-01-06 DIAGNOSIS — I480 Paroxysmal atrial fibrillation: Secondary | ICD-10-CM | POA: Diagnosis not present

## 2016-01-06 DIAGNOSIS — Z6841 Body Mass Index (BMI) 40.0 and over, adult: Secondary | ICD-10-CM | POA: Diagnosis not present

## 2016-01-06 DIAGNOSIS — Q211 Atrial septal defect, unspecified: Secondary | ICD-10-CM

## 2016-01-06 DIAGNOSIS — Z79899 Other long term (current) drug therapy: Secondary | ICD-10-CM | POA: Diagnosis not present

## 2016-01-06 DIAGNOSIS — Z7901 Long term (current) use of anticoagulants: Secondary | ICD-10-CM | POA: Diagnosis not present

## 2016-01-06 DIAGNOSIS — I4891 Unspecified atrial fibrillation: Secondary | ICD-10-CM | POA: Diagnosis not present

## 2016-01-06 DIAGNOSIS — R310 Gross hematuria: Secondary | ICD-10-CM | POA: Diagnosis not present

## 2016-01-06 DIAGNOSIS — Z01810 Encounter for preprocedural cardiovascular examination: Secondary | ICD-10-CM

## 2016-01-06 DIAGNOSIS — I10 Essential (primary) hypertension: Secondary | ICD-10-CM

## 2016-01-06 DIAGNOSIS — Z7984 Long term (current) use of oral hypoglycemic drugs: Secondary | ICD-10-CM | POA: Diagnosis not present

## 2016-01-06 DIAGNOSIS — Z8774 Personal history of (corrected) congenital malformations of heart and circulatory system: Secondary | ICD-10-CM | POA: Diagnosis not present

## 2016-01-06 DIAGNOSIS — N201 Calculus of ureter: Secondary | ICD-10-CM | POA: Diagnosis not present

## 2016-01-06 LAB — BASIC METABOLIC PANEL
Anion gap: 8 (ref 5–15)
BUN: 9 mg/dL (ref 6–20)
CHLORIDE: 105 mmol/L (ref 101–111)
CO2: 26 mmol/L (ref 22–32)
Calcium: 9 mg/dL (ref 8.9–10.3)
Creatinine, Ser: 0.61 mg/dL (ref 0.44–1.00)
GFR calc Af Amer: >60 mL/min (ref >60)
GFR calc non Af Amer: >60 mL/min (ref >60)
GLUCOSE: 126 mg/dL — AB (ref 65–99)
Potassium: 3.8 mmol/L (ref 3.5–5.1)
Sodium: 139 mmol/L (ref 135–145)

## 2016-01-06 LAB — CBC
HEMATOCRIT: 26.6 % — AB (ref 36.0–46.0)
HEMOGLOBIN: 7.2 g/dL — AB (ref 12.0–15.0)
MCH: 18.4 pg — AB (ref 26.0–34.0)
MCHC: 27.1 g/dL — AB (ref 30.0–36.0)
MCV: 67.9 fL — ABNORMAL LOW (ref 78.0–100.0)
Platelets: 458 10*3/uL — ABNORMAL HIGH (ref 150–400)
RBC: 3.92 MIL/uL (ref 3.87–5.11)
RDW: 17.8 % — ABNORMAL HIGH (ref 11.5–15.5)
WBC: 8.8 10*3/uL (ref 4.0–10.5)

## 2016-01-06 LAB — HCG, SERUM, QUALITATIVE: Preg, Serum: NEGATIVE

## 2016-01-06 NOTE — Progress Notes (Signed)
Patient ID: Joy Le, female   DOB: 1976-08-20, 40 y.o.   MRN: [id number]     Cardiology Office Note    Date:  01/06/2016   ID:  Joy Le, DOB [birth year], MRN [id number]  PCP:  Ninetta Lights, MD  Cardiologist:   Thurmon Fair, MD   Chief Complaint  Patient presents with  . Annual Exam    no  chest pain, no shortness of breath, no edema,  no pain or cramping in legs, no lightheadedness or dizziness, no fatigue    History of Present Illness:  IZZIE Le is a 40 y.o. female with a history of ostium primum atrial septal defect and congenital mitral valve insufficiency repaired surgically at age 52 who has had problems with intermittent paroxysmal atrial fibrillation. Recently, she has had constant hematuria related to nephrolithiasis and is scheduled to undergo cystoscopy with ureteroscopy and stone extraction followed by placement of a stent on Friday, March 3. She tells me that Dr. Marlou Porch plans to remove the stent the following Monday, March 6.  Given the hematuria she has been feeling very well. She continues to work as an Programmer, systems. She denies exertional dyspnea, chest discomfort, leg edema, syncope, focal neurological complaints, excessive fatigue. She has not had palpitations in a long time. Her youngest child is now 21 years old.    Past Medical History  Diagnosis Date  . Resistance to insulin   . ASD (atrial septal defect)     ASD repair @ age 40 years old  . Irregular menstrual cycle   . Cystitis   . Diabetes mellitus     Type I GDM  . Hypertension     Chronic HTN  . H/O: obesity   . H/O menorrhagia 04/28/04  . Oligomenorrhea 04/28/04  . Irregular menses 10/27/06  . Congenital heart defect     Atrial ventricular defect  . Gestational diabetes 2008  . Cleft leaflet, mitral valve     anterior  . Systemic hypertension   . Heart murmur     at birth - yearly check ups  . Asthma     No longer has - had only with 1st preg 2008  . Headache(784.0)     otc  meds prn  . Atrial flutter (HCC)     x 2 - a fib    Past Surgical History  Procedure Laterality Date  . Wisdom tooth extraction    . Cesarean section  2008  . Asd repair  1984    [facility]  . Cardioversion  01/25/2011    successful  . Cardiac catheterization  04/27/2005    No sign. CAD,mod. MR,elevated LV end-diastolic pressure,EF 50% w/mild anterolateral hypokinesis  . US echocardiography  03/29/2011    mild to mod MR, mild TR,EF 60-65%  . Cesarean section with bilateral tubal ligation Bilateral 07/04/2013    Procedure: REPEAT CESAREAN SECTION WITH BILATERAL TUBAL LIGATION;  Surgeon: Kirkland Hun, MD;  Location: [facility];  Service: Obstetrics;  Laterality: Bilateral;    Outpatient Prescriptions Prior to Visit  Medication Sig Dispense Refill  . CINNAMON PO Take 2 capsules by mouth daily.     . cyanocobalamin 1000 MCG tablet Take 1,000 mcg by mouth daily.    Marland Kitchen diltiazem (CARTIA XT) 120 MG 24 hr capsule Take 1 capsule (120 mg total) by mouth daily. KEEP UPCOMING APPOINTMENT 30 capsule 2  . metoprolol succinate (TOPROL-XL) 50 MG 24 hr tablet Take 1 tablet (50 mg total) by mouth daily. KEEP UPCOMING APPOINTMENT 30 tablet  2  . Multiple Vitamins-Minerals (MULTIVITAMIN WITH MINERALS) tablet Take 1 tablet by mouth daily.    . Potassium Gluconate 595 MG TBCR Take 595 mg by mouth daily.    . rivaroxaban (XARELTO) 20 MG TABS tablet Take 1 tablet (20 mg total) by mouth daily with supper. 30 tablet 5  . metFORMIN (GLUCOPHAGE) 500 MG tablet Take 1,000 mg by mouth 2 (two) times daily with a meal.     . enoxaparin (LOVENOX) 120 MG/0.8ML injection Inject 0.8 mLs (120 mg total) into the skin every 12 (twelve) hours. (Patient not taking: Reported on 12/24/2015) 60 Syringe 3  . hydrochlorothiazide (HYDRODIURIL) 25 MG tablet Take 1 tablet (25 mg total) by mouth daily. (Patient not taking: Reported on 12/24/2015) 7 tablet 0  . labetalol (NORMODYNE) 200 MG tablet Take 2 tablets (400 mg total) by mouth 3 (three)  times daily. (Patient not taking: Reported on 12/24/2015) 90 tablet 1   No facility-administered medications prior to visit.     Allergies:   Codeine   Social History   Social History  . Marital Status: Married    Spouse Name: Seline Enzor "BJ"  . Number of Children: 1  . Years of Education: 20   Occupational History  . Teacher [employer]   Social History Main Topics  . Smoking status: Never Smoker   . Smokeless tobacco: Never Used  . Alcohol Use: Yes     Comment: occasionally prior to pregnancy  . Drug Use: No  . Sexual Activity:    Partners: Male    Pharmacist, hospital Protection: None, Sponge     Comment: pregnant   Other Topics Concern  . None   Social History Narrative     Family History:  The patient's family history includes Alcohol abuse in her paternal grandfather; Alzheimer's disease in her maternal grandfather; Anemia in her sister; Diabetes in her maternal aunt, maternal grandmother, mother, and paternal aunt; Hypertension in her father, mother, and sister; Lung cancer in her paternal grandfather; Stroke in her mother.   ROS:   Please see the history of present illness.    ROS All other systems reviewed and are negative.   PHYSICAL EXAM:   VS:  BP 124/62 mmHg  Pulse 72  Ht 5' 1.5" (1.562 m)  Wt 118.616 kg (261 lb 8 oz)  BMI 48.62 kg/m2   GEN: Well nourished, well developed, in no acute distress HEENT: normal Neck: no JVD, carotid bruits, or masses Cardiac: RRR; no murmurs, rubs, or gallops,no edema  Respiratory:  clear to auscultation bilaterally, normal work of breathing GI: soft, nontender, nondistended, + BS MS: no deformity or atrophy Skin: warm and dry, no rash Neuro:  Alert and Oriented x 3, Strength and sensation are intact Psych: euthymic mood, full affect  Wt Readings from Last 3 Encounters:  01/06/16 118.616 kg (261 lb 8 oz)  08/20/14 122.018 kg (269 lb)  07/08/13 128.595 kg (283 lb 8 oz)      Studies/Labs Reviewed:    EKG:  EKG is ordered today.  The ekg ordered today demonstrates normal sinus rhythm, first-degree AV block (264 ms), left atrial abnormality, minor intraventricular conduction delay (QRS duration 114 ms (normal QTC 416 ms)     ASSESSMENT:    1. Paroxysmal atrial fibrillation (HCC)   2. Mitral valve insufficiency, congenital   3. Atrial septal defect   4. Essential hypertension   5. Morbid obesity due to excess calories (HCC)   6. Preoperative cardiovascular examination  PLAN:  In order of problems listed above:  1. AFib: No recent clinical symptoms to suggest arrhythmia, on appropriate direct oral anticoagulant. CHADSVasc 2 (HTN, gender) and history of previous cardiac surgery. She does not have "valvular" atrial fibrillation. It is important to continue taking her metoprolol on the day of the procedure. Her risk of embolic events is relatively low. She will stop taking Xarelto as of today and it is probably safest for her to delay restarting it until after removal of her stent on Monday. 2. MR: Mild to moderate mitral regurgitation without evidence of left ventricular dysfunction or dilatation on last echo 3. ASD: No evidence of residual defect by last echocardiogram. Long AV conduction time consistent with previous ostium primum, borderline leftward axis. 4. HTN: Well controlled 5. Obesity: Losing substantial weight is an important mid and long-term goal. Discussed ways to achieve this.  Risk of major cardiorespiratory perioperative complications with the planned cystoscopy-ureteroscopy is low, but it is important that she continue taking her beta blocker to reduce the likelihood of perioperative atrial fibrillation with rapid ventricular response.  Medication Adjustments/Labs and Tests Ordered: Current medicines are reviewed at length with the patient today.  Concerns regarding medicines are outlined above.  Medication changes, Labs and Tests ordered today are listed in the  Patient Instructions below. Patient Instructions  Medication Instructions:  HOLD Xarelto starting 01/06/16 RESUME on the evening of 01/10/16  Labwork: None ordered  Testing/Procedures: None ordered  Follow-Up: Your physician wants you to follow-up in: 12 months with Dr.Rydan Gulyas You will receive a reminder letter in the mail two months in advance. If you don't receive a letter, please call our office to schedule the follow-up appointment.   Any Other Special Instructions Will Be Listed Below (If Applicable).     If you need a refill on your cardiac medications before your next appointment, please call your pharmacy.          Joie Bimler, MD  01/06/2016 10:39 AM    [facility] [address] [place], [place], [place]  [address] Phone: [phone]; Fax: [phone]

## 2016-01-06 NOTE — Progress Notes (Signed)
Called [facility] and spoke with [facility] ( Triage Nurse) and she stated she would put a note on desk of Dr Marlou Porch regarding HGb of 7.2 done 01/06/2016.  I also told her I had faxed labs to [facility] to Dr Marlou Porch at [phone].  Gwen stated that he had finished surgery at [facility] and usually comes back to office after that and she would leave him a note on his desk.

## 2016-01-06 NOTE — Progress Notes (Signed)
01/06/2016- LOV- DR Croituri- EPIC  EKG-01/06/16-EPIC

## 2016-01-06 NOTE — Patient Instructions (Signed)
Medication Instructions:  HOLD Xarelto starting 01/06/16 RESUME on the evening of 01/10/16  Labwork: None ordered  Testing/Procedures: None ordered  Follow-Up: Your physician wants you to follow-up in: 12 months with Dr.Croitoru You will receive a reminder letter in the mail two months in advance. If you don't receive a letter, please call our office to schedule the follow-up appointment.   Any Other Special Instructions Will Be Listed Below (If Applicable).     If you need a refill on your cardiac medications before your next appointment, please call your pharmacy.

## 2016-01-06 NOTE — Progress Notes (Signed)
Spoke with Dedra Skeens ( Triage Nurse) at [facility] and she stated that Dr Berniece Salines was aware of hgb results of 7.2 on 01/06/2016.  Gwen stated that per Dr Marlou Porch surgery would not be canceled and Dr Marlou Porch was going to speak with her PCP physician and with patient per [facility] regarding the Hgb results on 01/06/2016.

## 2016-01-06 NOTE — Progress Notes (Signed)
CBC done 01/06/2016 faxed via EPIC to Dr Berniece Salines.

## 2016-01-07 ENCOUNTER — Telehealth: Payer: Self-pay | Admitting: Cardiovascular Disease

## 2016-01-07 DIAGNOSIS — R899 Unspecified abnormal finding in specimens from other organs, systems and tissues: Secondary | ICD-10-CM

## 2016-01-07 NOTE — Progress Notes (Signed)
Called Heart Care office at [facility].  Spoke with Gwen at office.  She will go look for EKG with Dr Gwenlyn Saran and if he has signed off on the EKG done 01/06/2016 she will scan into EPIC.  Dedra Skeens is aware surgery on 01/08/2016.

## 2016-01-07 NOTE — Progress Notes (Signed)
Called [facility] office ( Dr Gwenlyn Saran ) on 01/07/16 and left voice message for Medical Records and asked that EKG done 01/06/2016 at time of cardiology visit be scanned into EPIC due to surgery on 01/08/2016.  The interpretation of the EKG is in the office visit note of Dr Gwenlyn Saran on 01/06/2016.

## 2016-01-07 NOTE — Progress Notes (Addendum)
Anesthesia ( Dr Karlyne Greenspan ) made aware on 01/07/2016  of hgb of 7.2 on 01/06/2016 with followup telephone notes in EPIC regarding Zarelto and being placed on Iron for decreased hemoglobin per Dr Marlou Porch and Dr Gwenlyn Saran on 01/07/2016.   Patient asymptomatic at time of preop appointment on 01/06/2016.  PT/INR being checked on 01/08/16 am.  No further orders given from anesthesia.

## 2016-01-07 NOTE — Telephone Encounter (Signed)
-----   Message from Thurmon Fair, MD sent at 01/07/2016 10:27 AM EST ----- Hgb 7.2 on preop labs. Dr. Marlou Porch will prescribe Fe supplement. Stay off anticoagulation for one months. Recheck H/H in one month. Plan to restart Xarelto then if H/H is improving.

## 2016-01-07 NOTE — Telephone Encounter (Signed)
Hgb 7.2 on preop labs. Dr. Marlou Porch will prescribe Fe supplement. Stay off anticoagulation for one months. Recheck H/H in one month. Plan to restart Xarelto then if H/H is improving.

## 2016-01-07 NOTE — Telephone Encounter (Signed)
Dr. Marlou Porch will instruct patient post procedure today to hold Xarelto for one month and restart then if H/H is improving.  Forwarded this info to [place], PharmD.  Order placed for H/H in one month and mailed to patient along with a note to have done if Dr. Marlou Porch has not already ordered.

## 2016-01-08 ENCOUNTER — Encounter (HOSPITAL_COMMUNITY): Payer: Self-pay | Admitting: *Deleted

## 2016-01-08 ENCOUNTER — Ambulatory Visit (HOSPITAL_COMMUNITY): Payer: BC Managed Care – PPO | Admitting: Anesthesiology

## 2016-01-08 ENCOUNTER — Ambulatory Visit (HOSPITAL_COMMUNITY)
Admission: RE | Admit: 2016-01-08 | Discharge: 2016-01-08 | Disposition: A | Discharge status: Home / Self Care | Payer: BC Managed Care – PPO | Source: Ambulatory Visit | Attending: Urology | Admitting: Urology

## 2016-01-08 ENCOUNTER — Ambulatory Visit (HOSPITAL_COMMUNITY): Payer: BC Managed Care – PPO

## 2016-01-08 ENCOUNTER — Encounter (HOSPITAL_COMMUNITY)
Admission: RE | Disposition: A | Discharge status: Home / Self Care | Payer: Self-pay | Source: Ambulatory Visit | Attending: Urology

## 2016-01-08 DIAGNOSIS — Z6841 Body Mass Index (BMI) 40.0 and over, adult: Secondary | ICD-10-CM | POA: Insufficient documentation

## 2016-01-08 DIAGNOSIS — Z8774 Personal history of (corrected) congenital malformations of heart and circulatory system: Secondary | ICD-10-CM | POA: Insufficient documentation

## 2016-01-08 DIAGNOSIS — E119 Type 2 diabetes mellitus without complications: Secondary | ICD-10-CM | POA: Insufficient documentation

## 2016-01-08 DIAGNOSIS — N201 Calculus of ureter: Secondary | ICD-10-CM | POA: Insufficient documentation

## 2016-01-08 DIAGNOSIS — I4891 Unspecified atrial fibrillation: Secondary | ICD-10-CM | POA: Insufficient documentation

## 2016-01-08 DIAGNOSIS — R310 Gross hematuria: Secondary | ICD-10-CM | POA: Insufficient documentation

## 2016-01-08 DIAGNOSIS — N200 Calculus of kidney: Secondary | ICD-10-CM

## 2016-01-08 DIAGNOSIS — Z7984 Long term (current) use of oral hypoglycemic drugs: Secondary | ICD-10-CM | POA: Insufficient documentation

## 2016-01-08 DIAGNOSIS — I10 Essential (primary) hypertension: Secondary | ICD-10-CM | POA: Insufficient documentation

## 2016-01-08 DIAGNOSIS — R0602 Shortness of breath: Secondary | ICD-10-CM

## 2016-01-08 DIAGNOSIS — Z7901 Long term (current) use of anticoagulants: Secondary | ICD-10-CM | POA: Insufficient documentation

## 2016-01-08 DIAGNOSIS — Z79899 Other long term (current) drug therapy: Secondary | ICD-10-CM | POA: Insufficient documentation

## 2016-01-08 HISTORY — PX: CYSTOSCOPY/RETROGRADE/URETEROSCOPY/STONE EXTRACTION WITH BASKET: SHX5317

## 2016-01-08 HISTORY — PX: HOLMIUM LASER APPLICATION: SHX5852

## 2016-01-08 LAB — PROTIME-INR
INR: 0.94 (ref 0.00–1.49)
Prothrombin Time: 12.8 seconds (ref 11.6–15.2)

## 2016-01-08 LAB — GLUCOSE, CAPILLARY
GLUCOSE-CAPILLARY: 98 mg/dL (ref 65–99)
Glucose-Capillary: 148 mg/dL — ABNORMAL HIGH (ref 65–99)

## 2016-01-08 SURGERY — CYSTOSCOPY, WITH CALCULUS REMOVAL USING BASKET
Anesthesia: General | Laterality: Right

## 2016-01-08 MED ORDER — MIDAZOLAM HCL 2 MG/2ML IJ SOLN
INTRAMUSCULAR | Status: AC
  Filled 2016-01-08: qty 2

## 2016-01-08 MED ORDER — CIPROFLOXACIN IN D5W 400 MG/200ML IV SOLN
INTRAVENOUS | Status: AC
  Filled 2016-01-08: qty 200

## 2016-01-08 MED ORDER — FERROUS SULFATE 325 (65 FE) MG PO TBEC: 325.0000 mg | DELAYED_RELEASE_TABLET | Freq: Three times a day (TID) | ORAL | Status: DC

## 2016-01-08 MED ORDER — CIPROFLOXACIN HCL 500 MG PO TABS: 500.0000 mg | ORAL_TABLET | Freq: Once | ORAL | Status: DC

## 2016-01-08 MED ORDER — PHENAZOPYRIDINE HCL 200 MG PO TABS: 200.0000 mg | ORAL_TABLET | Freq: Three times a day (TID) | ORAL | Status: DC | PRN

## 2016-01-08 MED ORDER — SUCCINYLCHOLINE CHLORIDE 20 MG/ML IJ SOLN
INTRAMUSCULAR | Status: DC | PRN
  Administered 2016-01-08: 100 mg via INTRAVENOUS

## 2016-01-08 MED ORDER — IOHEXOL 300 MG/ML  SOLN
INTRAMUSCULAR | Status: DC | PRN
  Administered 2016-01-08: 10 mL via URETHRAL

## 2016-01-08 MED ORDER — FUROSEMIDE 10 MG/ML IJ SOLN
20.0000 mg | Freq: Once | INTRAMUSCULAR | Status: AC
  Administered 2016-01-08: 20 mg via INTRAVENOUS

## 2016-01-08 MED ORDER — CIPROFLOXACIN HCL 500 MG PO TABS: 500.0000 mg | ORAL_TABLET | Freq: Two times a day (BID) | ORAL | Status: DC

## 2016-01-08 MED ORDER — DEXAMETHASONE SODIUM PHOSPHATE 10 MG/ML IJ SOLN
INTRAMUSCULAR | Status: AC
Start: 2016-01-08 — End: 2016-01-08
  Filled 2016-01-08: qty 1

## 2016-01-08 MED ORDER — LACTATED RINGERS IV SOLN
INTRAVENOUS | Status: DC | PRN
  Administered 2016-01-08 (×2): via INTRAVENOUS

## 2016-01-08 MED ORDER — FENTANYL CITRATE (PF) 100 MCG/2ML IJ SOLN
25.0000 ug | INTRAMUSCULAR | Status: DC | PRN
  Administered 2016-01-08: 25 ug via INTRAVENOUS

## 2016-01-08 MED ORDER — HYDROMORPHONE HCL 1 MG/ML IJ SOLN
INTRAMUSCULAR | Status: AC
  Filled 2016-01-08: qty 1

## 2016-01-08 MED ORDER — FUROSEMIDE 10 MG/ML IJ SOLN
INTRAMUSCULAR | Status: AC
  Filled 2016-01-08: qty 2

## 2016-01-08 MED ORDER — TROSPIUM CHLORIDE ER 60 MG PO CP24: 60.0000 mg | ORAL_CAPSULE | Freq: Every day | ORAL | Status: DC

## 2016-01-08 MED ORDER — TRAMADOL HCL 50 MG PO TABS: 50.0000 mg | ORAL_TABLET | Freq: Four times a day (QID) | ORAL | Status: DC | PRN

## 2016-01-08 MED ORDER — DEXAMETHASONE SODIUM PHOSPHATE 10 MG/ML IJ SOLN
INTRAMUSCULAR | Status: DC | PRN
  Administered 2016-01-08: 5 mg via INTRAVENOUS

## 2016-01-08 MED ORDER — FENTANYL CITRATE (PF) 100 MCG/2ML IJ SOLN
INTRAMUSCULAR | Status: AC
  Filled 2016-01-08: qty 2

## 2016-01-08 MED ORDER — OXYCODONE HCL 5 MG/5ML PO SOLN: 5.0000 mg | Freq: Once | ORAL | Status: DC | PRN

## 2016-01-08 MED ORDER — MIDAZOLAM HCL 5 MG/5ML IJ SOLN
INTRAMUSCULAR | Status: DC | PRN
  Administered 2016-01-08: 2 mg via INTRAVENOUS

## 2016-01-08 MED ORDER — FENTANYL CITRATE (PF) 100 MCG/2ML IJ SOLN
INTRAMUSCULAR | Status: DC | PRN
  Administered 2016-01-08: 100 ug via INTRAVENOUS

## 2016-01-08 MED ORDER — PROPOFOL 10 MG/ML IV BOLUS
INTRAVENOUS | Status: DC | PRN
  Administered 2016-01-08: 200 mg via INTRAVENOUS
  Administered 2016-01-08: 20 mg via INTRAVENOUS

## 2016-01-08 MED ORDER — SODIUM CHLORIDE 0.9 % IR SOLN
Status: DC | PRN
  Administered 2016-01-08: 2000 mL

## 2016-01-08 MED ORDER — CIPROFLOXACIN IN D5W 400 MG/200ML IV SOLN
400.0000 mg | INTRAVENOUS | Status: AC
  Administered 2016-01-08: 400 mg via INTRAVENOUS

## 2016-01-08 MED ORDER — ONDANSETRON HCL 4 MG/2ML IJ SOLN
INTRAMUSCULAR | Status: AC
  Filled 2016-01-08: qty 2

## 2016-01-08 MED ORDER — PROPOFOL 10 MG/ML IV BOLUS
INTRAVENOUS | Status: AC
  Filled 2016-01-08: qty 40

## 2016-01-08 MED ORDER — LIDOCAINE HCL (CARDIAC) 20 MG/ML IV SOLN
INTRAVENOUS | Status: AC
  Filled 2016-01-08: qty 5

## 2016-01-08 MED ORDER — OXYCODONE HCL 5 MG PO TABS: 5.0000 mg | ORAL_TABLET | Freq: Once | ORAL | Status: DC | PRN

## 2016-01-08 MED ORDER — LACTATED RINGERS IV SOLN: INTRAVENOUS | Status: DC

## 2016-01-08 MED ORDER — ONDANSETRON HCL 4 MG/2ML IJ SOLN
INTRAMUSCULAR | Status: DC | PRN
  Administered 2016-01-08 (×4): 2 mg via INTRAVENOUS

## 2016-01-08 MED ORDER — ROCURONIUM BROMIDE 100 MG/10ML IV SOLN
INTRAVENOUS | Status: DC | PRN
  Administered 2016-01-08: 40 mg via INTRAVENOUS
  Administered 2016-01-08: 10 mg via INTRAVENOUS

## 2016-01-08 MED ORDER — MEPERIDINE HCL 50 MG/ML IJ SOLN: 6.2500 mg | INTRAMUSCULAR | Status: DC | PRN

## 2016-01-08 MED ORDER — SUGAMMADEX SODIUM 200 MG/2ML IV SOLN
INTRAVENOUS | Status: AC
  Filled 2016-01-08: qty 2

## 2016-01-08 MED ORDER — LIDOCAINE HCL (CARDIAC) 20 MG/ML IV SOLN
INTRAVENOUS | Status: DC | PRN
  Administered 2016-01-08: 100 mg via INTRAVENOUS

## 2016-01-08 MED ORDER — SUGAMMADEX SODIUM 200 MG/2ML IV SOLN
INTRAVENOUS | Status: DC | PRN
  Administered 2016-01-08: 200 mg via INTRAVENOUS

## 2016-01-08 MED ORDER — RIVAROXABAN 20 MG PO TABS: 20.0000 mg | ORAL_TABLET | Freq: Every day | ORAL | Status: DC

## 2016-01-08 MED ORDER — FENTANYL CITRATE (PF) 250 MCG/5ML IJ SOLN
INTRAMUSCULAR | Status: AC
  Filled 2016-01-08: qty 5

## 2016-01-08 MED ORDER — HYDROMORPHONE HCL 1 MG/ML IJ SOLN: 0.2500 mg | INTRAMUSCULAR | Status: DC | PRN

## 2016-01-08 SURGICAL SUPPLY — 26 items
BAG URO CATCHER STRL LF (MISCELLANEOUS) ×4 IMPLANT
BASKET DAKOTA 1.9FR 11X120 (BASKET) ×2 IMPLANT
BASKET ZERO TIP NITINOL 2.4FR (BASKET) IMPLANT
BSKT STON RTRVL ZERO TP 2.4FR (BASKET)
CATH URET 5FR 28IN OPEN ENDED (CATHETERS) ×4 IMPLANT
CLOTH BEACON ORANGE TIMEOUT ST (SAFETY) ×4 IMPLANT
FIBER LASER FLEXIVA 1000 (UROLOGICAL SUPPLIES) IMPLANT
FIBER LASER FLEXIVA 200 (UROLOGICAL SUPPLIES) ×2 IMPLANT
FIBER LASER FLEXIVA 365 (UROLOGICAL SUPPLIES) IMPLANT
FIBER LASER FLEXIVA 550 (UROLOGICAL SUPPLIES) IMPLANT
FIBER LASER TRAC TIP (UROLOGICAL SUPPLIES) IMPLANT
GLOVE BIOGEL M STRL SZ7.5 (GLOVE) ×4 IMPLANT
GOWN STRL REUS W/TWL XL LVL3 (GOWN DISPOSABLE) ×4 IMPLANT
GUIDEWIRE ANG ZIPWIRE 038X150 (WIRE) IMPLANT
GUIDEWIRE STR DUAL SENSOR (WIRE) ×4 IMPLANT
KIT BALLIN UROMAX 15FX10 (LABEL) IMPLANT
MANIFOLD NEPTUNE II (INSTRUMENTS) ×4 IMPLANT
PACK CYSTO (CUSTOM PROCEDURE TRAY) ×4 IMPLANT
SET HIGH PRES BAL DIL (LABEL) ×2
SHEATH ACCESS URETERAL 24CM (SHEATH) IMPLANT
SHEATH ACCESS URETERAL 38CM (SHEATH) ×2 IMPLANT
SHEATH ACCESS URETERAL 54CM (SHEATH) IMPLANT
STENT URET 6FRX24 CONTOUR (STENTS) ×2 IMPLANT
TUBING CONNECTING 10 (TUBING) ×3 IMPLANT
TUBING CONNECTING 10' (TUBING) ×1
WIRE COONS/BENSON .038X145CM (WIRE) ×2 IMPLANT

## 2016-01-08 NOTE — Progress Notes (Signed)
Portable chest xray done.

## 2016-01-08 NOTE — Op Note (Signed)
Preoperative diagnosis:  1. Right proximal ureteral stone   Postoperative diagnosis:  1. same   Procedure: 1. Cystoscopy, right retrograde pyelogram with interpretation 2. Right ureteral balloon dilation 3. Right ureteroscopy, laser lithotripsy, stone fragmentation and removal 4. Right ureteral stent placement  Surgeon: Crist Fat, MD  Anesthesia: General  Complications: None  Intraoperative findings:  1. Ureteropyelogram demonstrated a normal caliber ureter up to the level of the UPJ with a filling defect at the UVJ and a dilated collecting system above that.  Particularly, the patient's upper pole was dilated. 2. The patient's ureter was narrow and difficult to pass at the UVJ requiring need to balloon dilate this area with a 50F x 10cm UroMax balloon.  EBL: Stone fragments sent to [facility]  Specimens: None  Indication: Joy Le is a 40 y.o. patient with right upj stone.  After reviewing the management options for treatment, he elected to proceed with the above surgical procedure(s). We have discussed the potential benefits and risks of the procedure, side effects of the proposed treatment, the likelihood of the patient achieving the goals of the procedure, and any potential problems that might occur during the procedure or recuperation. Informed consent has been obtained.  Description of procedure:  The patient was taken to the operating room and general anesthesia was induced.  The patient was placed in the dorsal lithotomy position, prepped and draped in the usual sterile fashion, and preoperative antibiotics were administered. A preoperative time-out was performed.   A 30 21 French cystoscope was then gently passed into the patient's urethra and into the bladder.  There is a 30 cystoscopic evaluation was performed and noted to be normal.  I did advance a 5 [place] open-ended ureteral catheter into the right ureter and performed retrograde using 10 cc  of Omnipaque contrast with the above findings.  I then advanced a 0.38 sensor wire through the open ended catheter and advanced this up into the right renal pelvis.  The ureteral catheter was then removed as was the cystoscope.  I then advanced a 4/6 French semirigid ureteroscope into the bladder and cannulated the right ureteral orifice using a second wire as a guide.  I was able to advance up to the right UPJ.  I did not encounter the stone and as such left the wire in place and removed the scope over the wire.  I then attempted to advance a 12/14 [place] ureteral access sheath over the second wire but was unable to pass the intra-ureteral ureter because of the tight UVJ.  As such I used a 15 French 10 cm balloon dilator and dilated the distal ureter over the second wire and left it dilated for approximately 90 seconds.  Once the balloon was taken down and removed those able to pass the ureteral access sheath without difficulty up into the proximal ureter.  I then advanced the flexible ureteroscope through the access sheath and into the renal pelvis.  Navigating the renal pelvis and noted the stone to be located in the upper pole.  As such, used a 200  fiber with settings of 60.6 J and 6 Hz and fragmented the stone into several smaller pieces.  The pieces were then retrieved using the [place] nitinol basket.  All large stone fragments were removed in their entirety.  There were several stone fragments remaining but were too small to grasp with a basket.  I then backed out the flexible scope under visual guidance removing the access sheath simultaneously.  The ureter was noted to be intact and in excellent condition.  I then advanced a 24 cm 6 French double-J stent over the wire and into the right renal pelvis under fluoroscopic guidance.  Once the stent was noted to be up in the kidney advanced into the urethral meatus and remove the wire keeping the stent in the patient's bladder.  The stent position was  demonstrated with a final fluoroscopic shots.  The bladder was subsequently emptied and the stent tether secured to the inner part of the patient's thigh.  She was subsequently extubated and returned to PACU in excellent condition.  Crist FatBenjamin W. Shona Pardo, M.D.

## 2016-01-08 NOTE — Anesthesia Procedure Notes (Signed)
Procedure Name: Intubation Date/Time: 01/08/2016 11:10 AM Performed by: Army FossaPULLIAM, Kristalyn Bergstresser DANE Pre-anesthesia Checklist: Patient identified, Emergency Drugs available, Suction available, Patient being monitored and Timeout performed Patient Re-evaluated:Patient Re-evaluated prior to inductionOxygen Delivery Method: Circle system utilized Preoxygenation: Pre-oxygenation with 100% oxygen Intubation Type: IV induction Ventilation: Mask ventilation without difficulty Laryngoscope Size: Mac and 3 Grade View: Grade II Tube type: Oral Tube size: 6.5 mm Number of attempts: 1 Airway Equipment and Method: Patient positioned with wedge pillow and Stylet Placement Confirmation: ETT inserted through vocal cords under direct vision,  positive ETCO2,  CO2 detector and breath sounds checked- equal and bilateral Secured at: 22 cm Tube secured with: Tape Dental Injury: Teeth and Oropharynx as per pre-operative assessment

## 2016-01-08 NOTE — Transfer of Care (Signed)
Immediate Anesthesia Transfer of Care Note  Patient: Joy Le  Procedure(s) Performed: Procedure(s): RIGHT URETEROSCOPY/LASER LITHOTRIPSY/STONE EXTRACTION WITH RETROGRADE PYELOGRAM AND RIGHT STENT PLACEMENT (Right) HOLMIUM LASER APPLICATION (N/A)  Patient Location: PACU  Anesthesia Type:General  Level of Consciousness:  sedated, patient cooperative and responds to stimulation  Airway & Oxygen Therapy:Patient Spontanous Breathing and Patient connected to face mask oxgen  Post-op Assessment:  Report given to PACU RN and Post -op Vital signs reviewed and remained with pt until vital signs were stable.  Pt follows commands.  Pt oriented to person, place and time.    Post vital signs:  Vital signs reviewed.  Remained at bedside until pt with stable vital signs.  Dr. Ivin Bootyrews made aware of pt in recovery room with O2 sats initially in 70s and currently in 90s.  Pt able to follow commands.  Pt oriented to person, place and time.    Last Vitals:  Filed Vitals:   01/08/16 0853  BP: 145/73  Pulse: 79  Temp: 36.6 C  Resp: 18    Complications: No apparent anesthesia complications

## 2016-01-08 NOTE — H&P (Signed)
Reason For Visit Follow-up for gross hematuria   History of Present Illness 40-year-old American female with a past medical history of congenital heart defect and had open heart surgery. She now has intermittent A. fib. She is on xarelte oh. She presented to our clinic several years ago for gross hematuria. She then re-presented with intermittent flank pain and ongoing gross hematuria. A repeat CT scan demonstrated a 10 mm stone at the right UPJ. The patient presents today for discussion of further management.  The patient describes intermittent severe right flank pain. She's also had gross hematuria. She denies any fevers or chills. She denies any progressive voiding symptoms. She denies any weight loss.   Current Meds 1. B-12 TABS;  Therapy: (Recorded:23Dec2014) to Recorded 2. Cinnamon CAPS;  Therapy: (Recorded:23Dec2014) to Recorded 3. DiltiaZEM HCl ER Coated Beads 120 MG Oral Capsule Extended Release 24 Hour;  Therapy: 23Jul2014 to Recorded 4. MetFORMIN HCl - 500 MG Oral Tablet;  Therapy: 27Feb2014 to Recorded 5. Metoprolol Succinate ER 50 MG Oral Tablet Extended Release 24 Hour;  Therapy: 17Nov2014 to Recorded 6. Multi-Vitamin TABS;  Therapy: (Recorded:23Dec2014) to Recorded 7. Xarelto 20 MG Oral Tablet;  Therapy: 16May2014 to Recorded  Allergies Medication  1. Codeine Derivatives  Social History Problems  1. Never smoker  Vitals Vital Signs [Data Includes: Last 1 Day]  Recorded: 03Feb2017 03:32PM  Height: 5 ft 1.5 in Weight: 263 lb  BMI Calculated: 48.89 BSA Calculated: 2.13 Blood Pressure: 113 / 62 Heart Rate: 89  Physical Exam Constitutional: Well nourished and well developed . No acute distress.  ENT:. The ears and nose are normal in appearance.  Neck: The appearance of the neck is normal and no neck mass is present.  Pulmonary: No respiratory distress and normal respiratory rhythm and effort.  Cardiovascular: Heart rate and rhythm are normal . No peripheral  edema. A grade /6 systolic murmur was heard.  Abdomen: The abdomen is obese. The abdomen is soft and nontender. No masses are palpated. No CVA tenderness. No hernias are palpable. No hepatosplenomegaly noted.    Results/Data Urine [Data Includes: Last 1 Day]   03Feb2017  COLOR BROWN   APPEARANCE TURBID   SPECIFIC GRAVITY    pH TNP   GLUCOSE TNP   BILIRUBIN TNP   KETONE TNP   BLOOD TNP   PROTEIN TNP   NITRITE TNP   LEUKOCYTE ESTERASE TNP   SQUAMOUS EPITHELIAL/HPF NONE SEEN HPF  WBC 0-5 WBC/HPF  RBC >60 RBC/HPF  BACTERIA  HPF  CRYSTALS See Below HPF  CASTS NONE SEEN LPF  Yeast NONE SEEN HPF   I've independently reviewed the patient's CT scan which demonstrates a right-sided 10 mm stone in the UPJ. She also has an additional stone in the left kidney.   Assessment Assessed  1. Gross hematuria (R31.0) 2. Nephrolithiasis (N20.0)  Plan Health Maintenance  1. UA With REFLEX; [Do Not Release]; Status:Resulted - Requires Verification;   Done:  03Feb2017 03:21PM  Discussion/Summary The patient has an intermittently obstructing right UPJ stone. This is likely also the cause of her gross hematuria. The patient would like this stone treated given the nagging and persistence of symptoms that she has been experiencing. Carbocaine factors that she is on xarelte toe. We discussed the treatment options for her which would include shockwave lithotripsy and ureteroscopy. The patient would like to go with ureteroscopy given the higher chance of being stone free. Further, the patient would not need to be off of anticoagulation quite as long. I requested   that the patient be off of anticoagulation for the surgery, she tells me that she often bridges with Lovenox. She understands that following the operation she may experience some bleeding, especially if the anticoagulant is restarted to early. We will plan to leave the stent behind with the stent tether. The patient will then be scheduled for follow-up  in 6 weeks with a renal ultrasound now.   

## 2016-01-08 NOTE — Discharge Instructions (Signed)
DISCHARGE INSTRUCTIONS FOR KIDNEY STONE/URETERAL STENT   MEDICATIONS:  1.  Resume all your other meds from home - except do not take any extra narcotic pain meds that you may have at home.  2. Trospium is to prevent bladder spasms and help reduce urinary frequency. 3. Pyridium is to help with the burning/stinging when you urinate. 4. Tramadol is for moderate/severe pain, otherwise taking upto 1000mg  every 6 hours of plainTylenol will help treat your pain.   5. Take Cipro one hour prior to removal of your stent.   ACTIVITY:  1. No strenuous activity x 1week  2. No driving while on narcotic pain medications  3. Drink plenty of water  4. Continue to walk at home - you can still get blood clots when you are at home, so keep active, but don't over do it.  5. May return to work/school tomorrow or when you feel ready   BATHING:  1. You can shower and we recommend daily showers  2. You have a string coming from your urethra: The stent string is attached to your ureteral stent. Do not pull on this.   SIGNS/SYMPTOMS TO CALL:  Please call us if you have a fever greater than 101.5, uncontrolled nausea/vomiting, uncontrolled pain, dizziness, unable to urinate, bloody urine, chest pain, shortness of breath, leg swelling, leg pain, redness around wound, drainage from wound, or any other concerns or questions.   You can reach us at [phone].   FOLLOW-UP:  1. You have an appointment in 6 weeks with a ultrasound of your kidneys prior.  2. You have a string attached to your stent, you may remove it on March 8th. To do this, pull the strings until the stents are completely removed. You may feel an odd sensation in your back.

## 2016-01-08 NOTE — Progress Notes (Signed)
Dr. Ivin Bootyrews in to see patient- made aware of patient's condition- including SA02S- aware patient is on non-rebreathing mask

## 2016-01-08 NOTE — Anesthesia Postprocedure Evaluation (Signed)
Anesthesia Post Note  Patient: Silvano BilisShanta M Belk  Procedure(s) Performed: Procedure(s) (LRB): RIGHT URETEROSCOPY/LASER LITHOTRIPSY/STONE EXTRACTION WITH RETROGRADE PYELOGRAM AND RIGHT STENT PLACEMENT (Right) HOLMIUM LASER APPLICATION (N/A)  Patient location during evaluation: PACU Anesthesia Type: General Level of consciousness: awake and alert Pain management: pain level controlled Vital Signs Assessment: post-procedure vital signs reviewed and stable Respiratory status: spontaneous breathing, nonlabored ventilation and respiratory function stable Cardiovascular status: blood pressure returned to baseline and stable Postop Assessment: no signs of nausea or vomiting Anesthetic complications: no    Last Vitals:  Filed Vitals:   01/08/16 1530 01/08/16 1545  BP: 149/64 150/66  Pulse: 66 69  Temp:  36.7 C  Resp: 20 18    Last Pain:  Filed Vitals:   01/08/16 1555  PainSc: 0-No pain                 Reese Senk A

## 2016-01-08 NOTE — Anesthesia Preprocedure Evaluation (Addendum)
Anesthesia Evaluation  Patient identified by MRN, date of birth, ID band Patient awake    Reviewed: Allergy & Precautions, NPO status , Patient's Chart, lab work & pertinent test results, reviewed documented beta blocker date and time   Airway Mallampati: I  TM Distance: >3 FB Neck ROM: Full    Dental  (+) Teeth Intact, Dental Advisory Given   Pulmonary    breath sounds clear to auscultation       Cardiovascular hypertension, Pt. on medications and Pt. on home beta blockers + dysrhythmias Atrial Fibrillation  Rhythm:Regular Rate:Normal     Neuro/Psych    GI/Hepatic   Endo/Other  diabetes, Well Controlled, Type 2, Oral Hypoglycemic AgentsMorbid obesity  Renal/GU      Musculoskeletal   Abdominal   Peds  Hematology   Anesthesia Other Findings   Reproductive/Obstetrics                            Anesthesia Physical Anesthesia Plan  ASA: III  Anesthesia Plan: General   Post-op Pain Management:    Induction: Intravenous  Airway Management Planned: Oral ETT  Additional Equipment:   Intra-op Plan:   Post-operative Plan: Extubation in OR  Informed Consent: I have reviewed the patients History and Physical, chart, labs and discussed the procedure including the risks, benefits and alternatives for the proposed anesthesia with the patient or authorized representative who has indicated his/her understanding and acceptance.   Dental advisory given  Plan Discussed with: CRNA, Surgeon and Anesthesiologist  Anesthesia Plan Comments:        Anesthesia Quick Evaluation

## 2016-01-11 ENCOUNTER — Encounter (HOSPITAL_COMMUNITY): Payer: Self-pay | Admitting: Urology

## 2016-01-24 ENCOUNTER — Other Ambulatory Visit: Payer: Self-pay | Admitting: Cardiovascular Disease

## 2016-01-25 NOTE — Telephone Encounter (Signed)
Rx request sent to pharmacy.  

## 2016-02-23 LAB — HEMOGLOBIN AND HEMATOCRIT, BLOOD
HCT: 33.1 % — ABNORMAL LOW (ref 35.0–45.0)
Hemoglobin: 9.6 g/dL — ABNORMAL LOW (ref 11.7–15.5)

## 2016-02-24 ENCOUNTER — Telehealth: Payer: Self-pay | Admitting: Cardiovascular Disease

## 2016-02-24 NOTE — Telephone Encounter (Signed)
New message ° ° ° ° °Returning a call to the nurse to get lab results °

## 2016-02-24 NOTE — Telephone Encounter (Signed)
LEFT DETAILED MESSAGE -IMPROVED- MILD ANEMIC ANY QUESTION -MAY CALL  BACK

## 2017-01-19 ENCOUNTER — Other Ambulatory Visit: Payer: Self-pay | Admitting: Cardiovascular Disease

## 2017-03-17 ENCOUNTER — Encounter: Payer: Self-pay | Admitting: Cardiovascular Disease

## 2017-03-17 ENCOUNTER — Ambulatory Visit (INDEPENDENT_AMBULATORY_CARE_PROVIDER_SITE_OTHER): Payer: BC Managed Care – PPO | Admitting: Cardiovascular Disease

## 2017-03-17 VITALS — BP 143/80 | HR 77 | Ht 62.5 in | Wt 274.0 lb

## 2017-03-17 DIAGNOSIS — Q233 Congenital mitral insufficiency: Secondary | ICD-10-CM

## 2017-03-17 DIAGNOSIS — Q211 Atrial septal defect, unspecified: Secondary | ICD-10-CM

## 2017-03-17 DIAGNOSIS — I480 Paroxysmal atrial fibrillation: Secondary | ICD-10-CM

## 2017-03-17 DIAGNOSIS — I10 Essential (primary) hypertension: Secondary | ICD-10-CM

## 2017-03-17 MED ORDER — APIXABAN 5 MG PO TABS: 5.0000 mg | ORAL_TABLET | Freq: Two times a day (BID) | ORAL | 11 refills | Status: DC

## 2017-03-17 NOTE — Patient Instructions (Signed)
Dr Royann Shiversroitoru has recommended making the following medication changes: 1. START Eliquis 5 mg - take 1 tablet by mouth twice daily  Your physician recommends that you schedule a follow-up appointment in 12 months. You will receive a reminder letter in the mail two months in advance. If you don't receive a letter, please call our office to schedule the follow-up appointment.  If you need a refill on your cardiac medications before your next appointment, please call your pharmacy.

## 2017-03-17 NOTE — Progress Notes (Signed)
Patient ID: Joy Le, female   DOB: 09-30-1976, [age] y.o.   MRN: [id number]     Cardiology Office Note    Date:  03/19/2017   ID:  Joy Le, DOB 09-30-1976, MRN [id number]  PCP:  Laqueta DueFurr, Sara M., MD  Cardiologist:   Thurmon FairMihai Abdel Effinger, MD   Chief Complaint  Patient presents with  . Follow-up    History of Present Illness:  Joy Le is a 41 y.o. female with a history of ostium primum atrial septal defect and congenital mitral valve insufficiency repaired surgically at age 89 who has had problems with intermittent paroxysmal atrial fibrillation. She has also had problems with nephrolithiasis and associated hematuria. Her anticoagulation was stopped for a cystoscopic stone extraction procedure but was not restarted. She has not had any interim problems with bleeding, focal neurological complaints or subjective palpitations.  The patient specifically denies any chest pain at rest exertion, dyspnea at rest or with exertion, orthopnea, paroxysmal nocturnal dyspnea, syncope, palpitations, focal neurological deficits, intermittent claudication, lower extremity edema, unexplained weight gain, cough, hemoptysis or wheezing.  She is reluctant to start Xarelto again due to the negative TV advertising.   Past Medical History:  Diagnosis Date  . ASD (atrial septal defect)    ASD repair @ age 41 years old  . Atrial flutter (HCC)    x 2 - a fib  . Cleft leaflet, mitral valve    anterior  . Congenital heart defect    Atrial ventricular defect  . Cystitis   . Diabetes mellitus    Type I GDM  . Gestational diabetes 2008  . H/O menorrhagia 04/28/04  . H/O: obesity   . Heart murmur    at birth - yearly check ups  . Hypertension    Chronic HTN  . Irregular menses 10/27/06  . Irregular menstrual cycle   . Oligomenorrhea 04/28/04  . Resistance to insulin   . Systemic hypertension     Past Surgical History:  Procedure Laterality Date  . ASD REPAIR  1984   [facility]  . CARDIAC  CATHETERIZATION  04/27/2005   No sign. CAD,mod. MR,elevated LV end-diastolic pressure,EF 50% w/mild anterolateral hypokinesis  . CARDIOVERSION  01/25/2011   successful  . CESAREAN SECTION  2008  . CESAREAN SECTION WITH BILATERAL TUBAL LIGATION Bilateral 07/04/2013   Procedure: REPEAT CESAREAN SECTION WITH BILATERAL TUBAL LIGATION;  Surgeon: Kirkland HunArthur Stringer, MD;  Location: [facility];  Service: Obstetrics;  Laterality: Bilateral;  . CYSTOSCOPY/RETROGRADE/URETEROSCOPY/STONE EXTRACTION WITH BASKET Right 01/08/2016   Procedure: RIGHT URETEROSCOPY/LASER LITHOTRIPSY/STONE EXTRACTION WITH RETROGRADE PYELOGRAM AND RIGHT STENT PLACEMENT;  Surgeon: Crist FatBenjamin W Herrick, MD;  Location: [facility];  Service: Urology;  Laterality: Right;  . HOLMIUM LASER APPLICATION N/A 01/08/2016   Procedure: HOLMIUM LASER APPLICATION;  Surgeon: Crist FatBenjamin W Herrick, MD;  Location: [facility];  Service: Urology;  Laterality: N/A;  . US ECHOCARDIOGRAPHY  03/29/2011   mild to mod MR, mild TR,EF 60-65%  . WISDOM TOOTH EXTRACTION      Outpatient Medications Prior to Visit  Medication Sig Dispense Refill  . CINNAMON PO Take 2 capsules by mouth daily.     Marland Kitchen. diltiazem (CARTIA XT) 120 MG 24 hr capsule Take 1 capsule (120 mg total) by mouth daily. 90 capsule 0  . metFORMIN (GLUCOPHAGE) 1000 MG tablet Take 1 tablet by mouth 2 (two) times daily. Take 1 tab by mouth twice a day    . metoprolol succinate (TOPROL-XL) 50 MG 24 hr tablet Take 1 tablet (50 mg  total) by mouth daily. 90 tablet 0  . ciprofloxacin (CIPRO) 500 MG tablet Take 1 tablet (500 mg total) by mouth once. 1 tablet 0  . cyanocobalamin 1000 MCG tablet Take 1,000 mcg by mouth daily.    . ferrous sulfate 325 (65 FE) MG EC tablet Take 1 tablet (325 mg total) by mouth 3 (three) times daily with meals. 90 tablet 3  . Multiple Vitamins-Minerals (MULTIVITAMIN WITH MINERALS) tablet Take 1 tablet by mouth daily.    . phenazopyridine (PYRIDIUM) 200 MG tablet Take 1 tablet (200 mg total) by mouth 3  (three) times daily as needed for pain. 10 tablet 0  . Potassium Gluconate 595 MG TBCR Take 595 mg by mouth daily.    . rivaroxaban (XARELTO) 20 MG TABS tablet Take 1 tablet (20 mg total) by mouth daily with supper. Hold for 1 month 30 tablet 5  . traMADol (ULTRAM) 50 MG tablet Take 1-2 tablets (50-100 mg total) by mouth every 6 (six) hours as needed for moderate pain. 15 tablet 0  . Trospium Chloride 60 MG CP24 Take 1 capsule (60 mg total) by mouth daily. 7 each 0   No facility-administered medications prior to visit.      Allergies:   Codeine   Social History   Social History  . Marital status: Married    Spouse name: Lerlene Treadwell "BJ"  . Number of children: 1  . Years of education: 60   Occupational History  . Teacher [employer]   Social History Main Topics  . Smoking status: Never Smoker  . Smokeless tobacco: Never Used  . Alcohol use No  . Drug use: No  . Sexual activity: Yes    Partners: Male    Birth control/ protection: None     Comment: pregnant   Other Topics Concern  . None   Social History Narrative  . None     Family History:  The patient's family history includes Alcohol abuse in her paternal grandfather; Alzheimer's disease in her maternal grandfather; Anemia in her sister; Diabetes in her maternal aunt, maternal grandmother, mother, and paternal aunt; Hypertension in her father, mother, and sister; Lung cancer in her paternal grandfather; Stroke in her mother.   ROS:   Please see the history of present illness.    ROS All other systems reviewed and are negative.   PHYSICAL EXAM:   VS:  BP (!) 143/80   Pulse 77   Ht 5' 2.5" (1.588 m)   Wt 274 lb (124.3 kg)   BMI 49.32 kg/m      General: Alert, oriented x3, no distress. Morbidly obese Head: no evidence of trauma, PERRL, EOMI, no exophtalmos or lid lag, no myxedema, no xanthelasma; normal ears, nose and oropharynx Neck: normal jugular venous pulsations and no hepatojugular reflux;  brisk carotid pulses without delay and no carotid bruits Chest: clear to auscultation, no signs of consolidation by percussion or palpation, normal fremitus, symmetrical and full respiratory excursions Cardiovascular: normal position and quality of the apical impulse, regular rhythm, normal first and second heart sounds, no murmurs, rubs or gallops Abdomen: no tenderness or distention, no masses by palpation, no abnormal pulsatility or arterial bruits, normal bowel sounds, no hepatosplenomegaly Extremities: no clubbing, cyanosis or edema; 2+ radial, ulnar and brachial pulses bilaterally; 2+ right femoral, posterior tibial and dorsalis pedis pulses; 2+ left femoral, posterior tibial and dorsalis pedis pulses; no subclavian or femoral bruits Neurological: grossly nonfocal   Wt Readings from Last 3 Encounters:  03/17/17  274 lb (124.3 kg)  01/08/16 263 lb (119.3 kg)  01/06/16 263 lb (119.3 kg)      Studies/Labs Reviewed:   EKG:  EKG is ordered today.  The ekg ordered today Normal sinus rhythm with first-degree A-V block (PR 256 ms), left atrial abnormality, left axis deviation, atypical IVCD, most closely resembling left bundle branch block, QRS 132 ms.  ASSESSMENT:    1. Paroxysmal atrial fibrillation (HCC)      PLAN:  In order of problems listed above:  1. AFib: Asymptomatic. No recent clinical symptoms to suggest arrhythmia. Need to restart anticoagulation. CHADSVasc 2 (HTN, gender) and history of previous cardiac surgery. She does not have "valvular" atrial fibrillation. Due to her concerns described above, will switch to Eliquis. 2. MR: Mild to moderate mitral regurgitation. Her murmur is not easily audible on exam and she did not have evidence of left ventricular dilation.  3. ASD: No evidence of residual defect by last echocardiogram. Long AV conduction time consistent with previous ostium primum,  4. HTN: Well controlled. When checked by school nurse for blood pressure is always  lower than this. 5. Obesity: Losing substantial weight is an important mid and long-term goal. Reviewed the direct relationship between atrial fibrillation burden in weight. Discussed ways to achieve weight reduction.  Risk of major cardiorespiratory perioperative complications with the planned cystoscopy-ureteroscopy is low, but it is important that she continue taking her beta blocker to reduce the likelihood of perioperative atrial fibrillation with rapid ventricular response.  Medication Adjustments/Labs and Tests Ordered: Current medicines are reviewed at length with the patient today.  Concerns regarding medicines are outlined above.  Medication changes, Labs and Tests ordered today are listed in the Patient Instructions below. Patient Instructions  Dr Royann Shivers has recommended making the following medication changes: 1. START Eliquis 5 mg - take 1 tablet by mouth twice daily  Your physician recommends that you schedule a follow-up appointment in 12 months. You will receive a reminder letter in the mail two months in advance. If you don't receive a letter, please call our office to schedule the follow-up appointment.  If you need a refill on your cardiac medications before your next appointment, please call your pharmacy.      Signed, Thurmon Fair, MD  03/19/2017 4:11 PM    [facility] [address] [place], [place], [place]  [address] Phone: [phone]; Fax: [phone]

## 2017-04-24 ENCOUNTER — Other Ambulatory Visit: Payer: Self-pay | Admitting: Cardiovascular Disease

## 2017-05-22 ENCOUNTER — Telehealth: Payer: Self-pay | Admitting: Cardiovascular Disease

## 2017-05-22 NOTE — Telephone Encounter (Signed)
Patient reports heavy flow, now 12 days in, to the point where she cannot leave home.  Reports large clots.  Stopped her Eliquis on Sunday.    She was previously on Xarelto and reports heavy periods, but nothing nearly like what she is experiencing this month.  She has not yet called her OB-GYN as she states this is an Eliquis problem.  I advised that she go ahead and contact the OB-GYN.  Patient willing to go back on Xarelto and also willing to try Pradaxa.  Does not want to go back on warfarin.  Will review with Dr. Royann Shiversroitoru and get in touch with patient in a day or two.   In the meantime, she will hold Eliquis until bleeding resolves.

## 2017-05-22 NOTE — Telephone Encounter (Signed)
Any other thoughts?

## 2017-05-22 NOTE — Telephone Encounter (Signed)
No. All anticoagulants are the same. Only solution is in the OB's field. MCr

## 2017-05-22 NOTE — Telephone Encounter (Signed)
Please call,she says she think she  is having a reaction to the Eliquis.She is having uncontrolled bleeding,her cycle have been on for 12 days.

## 2017-05-22 NOTE — Telephone Encounter (Signed)
LMTCB + advised to call OB-Gyn   Message routed to pharmacy staff for assistance w/Eliquis concern.

## 2017-05-24 NOTE — Telephone Encounter (Signed)
Spoke with patient - still no change in period, heavy flow with clots.   She was unable to get appt with GYN, so will see family MD tomorrow.   Asked that she have them send a copy of their findings to Dr. Salena Saner.    Patient not sure what medication she will be willing to take in the future.  She will get in touch with us after seeing PCP

## 2017-05-26 ENCOUNTER — Encounter (HOSPITAL_BASED_OUTPATIENT_CLINIC_OR_DEPARTMENT_OTHER): Payer: Self-pay | Admitting: *Deleted

## 2017-05-26 ENCOUNTER — Other Ambulatory Visit: Payer: Self-pay | Admitting: Obstetrics and Gynecology

## 2017-05-29 ENCOUNTER — Encounter (HOSPITAL_BASED_OUTPATIENT_CLINIC_OR_DEPARTMENT_OTHER): Payer: Self-pay | Admitting: *Deleted

## 2017-05-29 NOTE — Progress Notes (Signed)
NPO AFTER MN W/ EXCEPTION CLEAR LIQUIDS UNTIL 0900 (NO CREAM /MILK PRODUCTS).  ARRIVE AT 1300.  NEEDS CBC, BMET, URINE PREG. .   CURRENT EKG IN CHART AND EPIC.  WILL TAKE CARTIA AND TOPROL AM DOS W/ SIPS OF WATER.

## 2017-05-29 NOTE — H&P (Signed)
Admission History and Physical Exam for a Gynecology Patient  [name] is a 41 y.o. female, G2P2002, who presents for hysteroscopy, D and C, possible resection, and endometrial ablation. She is on Eliquis for atrial fib (stopped medication nine days ago because of menorrhagia). She C/O menorrhagia and irregular bleeding. She has had a tubal sterilization procedure. She has been followed at the [facility] of [facility].  OB History    Gravida Para Term Preterm AB Living   2 2 2     2    SAB TAB Ectopic Multiple Live Births           2      Past Medical History:  Diagnosis Date  . Anticoagulant long-term use    Eliquis  . First degree heart block   . Heart murmur    congenital --  per dr croitoru (cardiologist) note murmur not easily audible on exam and did not have evidence of left ventricular dilation  . History of gestational diabetes 2008  . History of kidney stones   . History of repair of congenital atrial septal defect (ASD) 751984  age 89---  cardiologist- dr croitoru   repair ostium primum atrial septal defect (no residual effects per last echo, long AV conduction time consistant w/ previous ostium primum) associated cleft anterior mitral leaflet , repaired   . Menorrhagia with irregular cycle   . Mitral regurgitation    pt is s/p repair cleft anterior Mitral leaflet / per last echo 03-29-2011  mild to moderate MR w/ improvement since last echo 05/ 2011, now mostly mild  . Nephrolithiasis urologist-  dr Marlou Porchherrick   left side non-obstructive per Renal US 04/ 2017  . PAF (paroxysmal atrial fibrillation) [facility]) cardiologist-  dr croitoru   DCCV 02-04-2011 successful (does note have valvular atrial fib.)  . Systemic hypertension   . Type 2 diabetes mellitus (HCC)     No prescriptions prior to admission.    Past Surgical History:  Procedure Laterality Date  . ASD REPAIR, OSTIUM PRIMUM  1984  age 89    [facility]   and repaired cleft anterior mitral valve leaflet defect (no prosthesis)  . CARDIAC CATHETERIZATION  04/27/2005   No sign. CAD,mod. MR,elevated LV end-diastolic pressure,EF 50% w/mild anterolateral hypokinesis  . CARDIOVASCULAR STRESS TEST  03/09/2005   Intermediate risk nuclear perfusion study w/ a primary fixed apical defect , represents either scar or marked apical thinning/  normal LV function and wall motion , ef 67%  . CARDIOVERSION  01/25/2011   successful  . CESAREAN SECTION  08/02/2007  . CESAREAN SECTION WITH BILATERAL TUBAL LIGATION Bilateral 07/04/2013   Procedure: REPEAT CESAREAN SECTION WITH BILATERAL TUBAL LIGATION;  Surgeon: Kirkland HunArthur Audrina Marten, MD;  Location: [facility];  Service: Obstetrics;  Laterality: Bilateral;  . CYSTOSCOPY/RETROGRADE/URETEROSCOPY/STONE EXTRACTION WITH BASKET Right 01/08/2016   Procedure: RIGHT URETEROSCOPY/LASER LITHOTRIPSY/STONE EXTRACTION WITH RETROGRADE PYELOGRAM AND RIGHT STENT PLACEMENT;  Surgeon: Crist FatBenjamin W Herrick, MD;  Location: [facility];  Service: Urology;  Laterality: Right;  . HOLMIUM LASER APPLICATION N/A 01/08/2016   Procedure: HOLMIUM LASER APPLICATION;  Surgeon: Crist FatBenjamin W Herrick, MD;  Location: [facility];  Service: Urology;  Laterality: N/A;  . TRANSTHORACIC ECHOCARDIOGRAM  03-29-2011  dr croitoru   mild concentricl LVH,  ef 60-65%,  E/A fusion due to 1st degree AVB, normal wall motion/ mild to moderate MR, posteriorly directed jet/  mild TR/  normal RVSP (compared to prior echo 05/ 2011 mild improvment  in the degree MR from moderate to more likely mild)  . WISDOM TOOTH EXTRACTION      Allergies  Allergen Reactions  . Codeine Other (See Comments)    Hallucination    Family History: family history includes Alcohol abuse in her paternal grandfather; Alzheimer's disease in her maternal grandfather; Anemia in her sister; Diabetes in her maternal aunt, maternal grandmother, mother, and paternal aunt; Hypertension in her father, mother, and sister; Lung cancer  in her paternal grandfather; Stroke in her mother.  Social History:  reports that she has never smoked. She has never used smokeless tobacco. She reports that she does not drink alcohol or use drugs.  Review of systems: See HPI.  Admission Physical Exam:    There is no height or weight on file to calculate BMI.  There were no vitals taken for this visit.  HEENT:                 Within normal limits Chest:                   Clear Heart:                    Regular rate and rhythm Breasts:                No masses, skin changes, bleeding, or discharge present Abdomen:             Nontender, no masses Extremities:          Grossly normal Neurologic exam: Grossly normal  Pelvic exam:  External genitalia: normal general appearance Vaginal: normal without tenderness, induration or masses Cervix: normal appearance Adnexa: normal bimanual exam Uterus: normal by exam, but difficult to outline. Rectal: good sphincter tone and no masses  Assessment:  Menorrhagia Atrial Fib Htn Diabetes Obesity  Plan:  Hysteroscopy, D and C, possible resection, and endometrial ablation. Risks and benefits reviewed.   Janine Limbo 05/29/2017

## 2017-05-30 ENCOUNTER — Encounter (HOSPITAL_BASED_OUTPATIENT_CLINIC_OR_DEPARTMENT_OTHER)
Admission: RE | Disposition: A | Discharge status: Home / Self Care | Payer: Self-pay | Source: Ambulatory Visit | Attending: Obstetrics and Gynecology

## 2017-05-30 ENCOUNTER — Ambulatory Visit (HOSPITAL_BASED_OUTPATIENT_CLINIC_OR_DEPARTMENT_OTHER)
Admission: RE | Admit: 2017-05-30 | Discharge: 2017-05-30 | Disposition: A | Discharge status: Home / Self Care | Payer: BC Managed Care – PPO | Source: Ambulatory Visit | Attending: Obstetrics and Gynecology | Admitting: Obstetrics and Gynecology

## 2017-05-30 ENCOUNTER — Ambulatory Visit (HOSPITAL_BASED_OUTPATIENT_CLINIC_OR_DEPARTMENT_OTHER): Payer: BC Managed Care – PPO | Admitting: Anesthesiology

## 2017-05-30 ENCOUNTER — Encounter (HOSPITAL_BASED_OUTPATIENT_CLINIC_OR_DEPARTMENT_OTHER): Payer: Self-pay

## 2017-05-30 DIAGNOSIS — R938 Abnormal findings on diagnostic imaging of other specified body structures: Secondary | ICD-10-CM | POA: Diagnosis not present

## 2017-05-30 DIAGNOSIS — E119 Type 2 diabetes mellitus without complications: Secondary | ICD-10-CM | POA: Insufficient documentation

## 2017-05-30 DIAGNOSIS — Z7901 Long term (current) use of anticoagulants: Secondary | ICD-10-CM | POA: Diagnosis not present

## 2017-05-30 DIAGNOSIS — I340 Nonrheumatic mitral (valve) insufficiency: Secondary | ICD-10-CM | POA: Insufficient documentation

## 2017-05-30 DIAGNOSIS — I440 Atrioventricular block, first degree: Secondary | ICD-10-CM | POA: Insufficient documentation

## 2017-05-30 DIAGNOSIS — Z79899 Other long term (current) drug therapy: Secondary | ICD-10-CM | POA: Diagnosis not present

## 2017-05-30 DIAGNOSIS — Z7984 Long term (current) use of oral hypoglycemic drugs: Secondary | ICD-10-CM | POA: Diagnosis not present

## 2017-05-30 DIAGNOSIS — D649 Anemia, unspecified: Secondary | ICD-10-CM | POA: Insufficient documentation

## 2017-05-30 DIAGNOSIS — Z6841 Body Mass Index (BMI) 40.0 and over, adult: Secondary | ICD-10-CM | POA: Diagnosis not present

## 2017-05-30 DIAGNOSIS — I480 Paroxysmal atrial fibrillation: Secondary | ICD-10-CM | POA: Diagnosis not present

## 2017-05-30 DIAGNOSIS — N921 Excessive and frequent menstruation with irregular cycle: Secondary | ICD-10-CM | POA: Insufficient documentation

## 2017-05-30 DIAGNOSIS — I10 Essential (primary) hypertension: Secondary | ICD-10-CM | POA: Insufficient documentation

## 2017-05-30 HISTORY — DX: Adverse effect of unspecified anesthetic, initial encounter: T41.45XA

## 2017-05-30 HISTORY — DX: Personal history of gestational diabetes: Z86.32

## 2017-05-30 HISTORY — DX: Personal history of urinary calculi: Z87.442

## 2017-05-30 HISTORY — DX: Personal history of (corrected) congenital malformations of heart and circulatory system: Z87.74

## 2017-05-30 HISTORY — DX: Paroxysmal atrial fibrillation: I48.0

## 2017-05-30 HISTORY — DX: Other complications of anesthesia, initial encounter: T88.59XA

## 2017-05-30 HISTORY — DX: Nonrheumatic mitral (valve) insufficiency: I34.0

## 2017-05-30 HISTORY — DX: Excessive and frequent menstruation with irregular cycle: N92.1

## 2017-05-30 HISTORY — DX: Calculus of kidney: N20.0

## 2017-05-30 HISTORY — DX: Atrioventricular block, first degree: I44.0

## 2017-05-30 HISTORY — DX: Long term (current) use of anticoagulants: Z79.01

## 2017-05-30 HISTORY — DX: Iron deficiency anemia, unspecified: D50.9

## 2017-05-30 HISTORY — DX: Type 2 diabetes mellitus without complications: E11.9

## 2017-05-30 HISTORY — PX: DILITATION & CURRETTAGE/HYSTROSCOPY WITH NOVASURE ABLATION: SHX5568

## 2017-05-30 LAB — BASIC METABOLIC PANEL
ANION GAP: 9 (ref 5–15)
BUN: 10 mg/dL (ref 6–20)
CALCIUM: 8.5 mg/dL — AB (ref 8.9–10.3)
CO2: 23 mmol/L (ref 22–32)
Chloride: 106 mmol/L (ref 101–111)
Creatinine, Ser: 0.53 mg/dL (ref 0.44–1.00)
GFR calc non Af Amer: >60 mL/min (ref >60)
Glucose, Bld: 107 mg/dL — ABNORMAL HIGH (ref 65–99)
Potassium: 3.5 mmol/L (ref 3.5–5.1)
SODIUM: 138 mmol/L (ref 135–145)

## 2017-05-30 LAB — CBC
HCT: 26.1 % — ABNORMAL LOW (ref 36.0–46.0)
HEMOGLOBIN: 8.6 g/dL — AB (ref 12.0–15.0)
MCH: 27.7 pg (ref 26.0–34.0)
MCHC: 33 g/dL (ref 30.0–36.0)
MCV: 84.2 fL (ref 78.0–100.0)
Platelets: 323 10*3/uL (ref 150–400)
RBC: 3.1 MIL/uL — AB (ref 3.87–5.11)
RDW: 14.5 % (ref 11.5–15.5)
WBC: 8.8 10*3/uL (ref 4.0–10.5)

## 2017-05-30 LAB — GLUCOSE, CAPILLARY
GLUCOSE-CAPILLARY: 100 mg/dL — AB (ref 65–99)
GLUCOSE-CAPILLARY: 120 mg/dL — AB (ref 65–99)

## 2017-05-30 LAB — POCT PREGNANCY, URINE: PREG TEST UR: NEGATIVE

## 2017-05-30 SURGERY — DILATATION & CURETTAGE/HYSTEROSCOPY WITH NOVASURE ABLATION
Anesthesia: General | Site: Uterus

## 2017-05-30 MED ORDER — PROPOFOL 10 MG/ML IV BOLUS
INTRAVENOUS | Status: DC | PRN
  Administered 2017-05-30: 50 mg via INTRAVENOUS

## 2017-05-30 MED ORDER — ONDANSETRON HCL 4 MG/2ML IJ SOLN
INTRAMUSCULAR | Status: DC | PRN
Start: 2017-05-30 — End: 2017-05-30
  Administered 2017-05-30: 4 mg via INTRAVENOUS

## 2017-05-30 MED ORDER — SODIUM CHLORIDE 0.9 % IR SOLN
Status: DC | PRN
  Administered 2017-05-30: 3000 mL

## 2017-05-30 MED ORDER — ONDANSETRON HCL 4 MG/2ML IJ SOLN
INTRAMUSCULAR | Status: AC
  Filled 2017-05-30: qty 2

## 2017-05-30 MED ORDER — DEXAMETHASONE SODIUM PHOSPHATE 10 MG/ML IJ SOLN
INTRAMUSCULAR | Status: DC | PRN
  Administered 2017-05-30: 10 mg via INTRAVENOUS

## 2017-05-30 MED ORDER — DEXAMETHASONE SODIUM PHOSPHATE 10 MG/ML IJ SOLN
INTRAMUSCULAR | Status: AC
  Filled 2017-05-30: qty 1

## 2017-05-30 MED ORDER — OXYCODONE HCL 5 MG/5ML PO SOLN
5.0000 mg | Freq: Once | ORAL | Status: DC | PRN
  Filled 2017-05-30: qty 5

## 2017-05-30 MED ORDER — KETOROLAC TROMETHAMINE 30 MG/ML IJ SOLN
INTRAMUSCULAR | Status: AC
  Filled 2017-05-30: qty 1

## 2017-05-30 MED ORDER — FENTANYL CITRATE (PF) 100 MCG/2ML IJ SOLN
INTRAMUSCULAR | Status: AC
  Filled 2017-05-30: qty 2

## 2017-05-30 MED ORDER — PROPOFOL 10 MG/ML IV BOLUS
INTRAVENOUS | Status: AC
  Filled 2017-05-30: qty 20

## 2017-05-30 MED ORDER — MEPERIDINE HCL 25 MG/ML IJ SOLN
6.2500 mg | INTRAMUSCULAR | Status: DC | PRN
  Filled 2017-05-30: qty 1

## 2017-05-30 MED ORDER — MIDAZOLAM HCL 2 MG/2ML IJ SOLN
INTRAMUSCULAR | Status: AC
  Filled 2017-05-30: qty 2

## 2017-05-30 MED ORDER — FENTANYL CITRATE (PF) 100 MCG/2ML IJ SOLN
25.0000 ug | INTRAMUSCULAR | Status: DC | PRN
  Filled 2017-05-30: qty 1

## 2017-05-30 MED ORDER — PROPOFOL 500 MG/50ML IV EMUL
INTRAVENOUS | Status: DC | PRN
Start: 2017-05-30 — End: 2017-05-30
  Administered 2017-05-30: 50 ug/kg/min via INTRAVENOUS

## 2017-05-30 MED ORDER — PROMETHAZINE HCL 25 MG/ML IJ SOLN
6.2500 mg | INTRAMUSCULAR | Status: DC | PRN
  Filled 2017-05-30: qty 1

## 2017-05-30 MED ORDER — BUPIVACAINE-EPINEPHRINE 0.5% -1:200000 IJ SOLN
INTRAMUSCULAR | Status: DC | PRN
  Administered 2017-05-30: 10 mL

## 2017-05-30 MED ORDER — LIDOCAINE 2% (20 MG/ML) 5 ML SYRINGE
INTRAMUSCULAR | Status: AC
  Filled 2017-05-30: qty 5

## 2017-05-30 MED ORDER — HYDROMORPHONE HCL 1 MG/ML IJ SOLN
0.2500 mg | INTRAMUSCULAR | Status: DC | PRN
  Filled 2017-05-30: qty 0.5

## 2017-05-30 MED ORDER — LACTATED RINGERS IV SOLN
INTRAVENOUS | Status: DC
  Filled 2017-05-30: qty 1000

## 2017-05-30 MED ORDER — LACTATED RINGERS IV SOLN
INTRAVENOUS | Status: DC
  Administered 2017-05-30 (×2): via INTRAVENOUS
  Filled 2017-05-30: qty 1000

## 2017-05-30 MED ORDER — FENTANYL CITRATE (PF) 250 MCG/5ML IJ SOLN
INTRAMUSCULAR | Status: DC | PRN
Start: 2017-05-30 — End: 2017-05-30
  Administered 2017-05-30 (×2): 50 ug via INTRAVENOUS

## 2017-05-30 MED ORDER — OXYCODONE-ACETAMINOPHEN 5-325 MG PO TABS: 1.0000 | ORAL_TABLET | ORAL | 0 refills | Status: DC | PRN

## 2017-05-30 MED ORDER — IBUPROFEN 800 MG PO TABS: 800.0000 mg | ORAL_TABLET | Freq: Three times a day (TID) | ORAL | 1 refills | Status: DC | PRN

## 2017-05-30 MED ORDER — KETOROLAC TROMETHAMINE 30 MG/ML IJ SOLN
INTRAMUSCULAR | Status: DC | PRN
  Administered 2017-05-30: 30 mg via INTRAVENOUS

## 2017-05-30 MED ORDER — MIDAZOLAM HCL 5 MG/5ML IJ SOLN
INTRAMUSCULAR | Status: DC | PRN
  Administered 2017-05-30: 2 mg via INTRAVENOUS

## 2017-05-30 MED ORDER — OXYCODONE HCL 5 MG PO TABS
5.0000 mg | ORAL_TABLET | Freq: Once | ORAL | Status: DC | PRN
  Filled 2017-05-30: qty 1

## 2017-05-30 MED ORDER — KETOROLAC TROMETHAMINE 60 MG/2ML IM SOLN
INTRAMUSCULAR | Status: DC | PRN
  Administered 2017-05-30: 30 mg via INTRAMUSCULAR

## 2017-05-30 MED ORDER — PROPOFOL 500 MG/50ML IV EMUL
INTRAVENOUS | Status: AC
  Filled 2017-05-30: qty 50

## 2017-05-30 MED ORDER — LIDOCAINE 2% (20 MG/ML) 5 ML SYRINGE
INTRAMUSCULAR | Status: DC | PRN
  Administered 2017-05-30: 50 mg via INTRAVENOUS

## 2017-05-30 SURGICAL SUPPLY — 36 items
ABLATOR ENDOMETRIAL BIPOLAR (ABLATOR) ×2 IMPLANT
BIPOLAR CUTTING LOOP 21FR (ELECTRODE)
CANISTER SUCT 3000ML PPV (MISCELLANEOUS) ×3 IMPLANT
CATH ROBINSON RED A/P 16FR (CATHETERS) ×1 IMPLANT
COVER BACK TABLE 60X90IN (DRAPES) ×3 IMPLANT
DILATOR CANAL MILEX (MISCELLANEOUS) IMPLANT
DRAPE HYSTEROSCOPY (DRAPE) ×3 IMPLANT
DRAPE LG THREE QUARTER DISP (DRAPES) ×3 IMPLANT
DRSG TELFA 3X8 NADH (GAUZE/BANDAGES/DRESSINGS) ×3 IMPLANT
ELECT REM PT RETURN 9FT ADLT (ELECTROSURGICAL)
ELECTRODE REM PT RTRN 9FT ADLT (ELECTROSURGICAL) IMPLANT
GAUZE VASELINE 1X8 (GAUZE/BANDAGES/DRESSINGS) IMPLANT
GLOVE BIOGEL PI IND STRL 8.5 (GLOVE) ×1 IMPLANT
GLOVE BIOGEL PI INDICATOR 8.5 (GLOVE) ×2
GLOVE ECLIPSE 8.0 STRL XLNG CF (GLOVE) ×6 IMPLANT
GOWN STRL REUS W/ TWL XL LVL3 (GOWN DISPOSABLE) ×2 IMPLANT
GOWN STRL REUS W/TWL XL LVL3 (GOWN DISPOSABLE) ×6
IV LACTATED RINGER IRRG 3000ML (IV SOLUTION) ×3
IV LR IRRIG 3000ML ARTHROMATIC (IV SOLUTION) ×1 IMPLANT
KIT RM TURNOVER CYSTO AR (KITS) ×3 IMPLANT
LEGGING LITHOTOMY PAIR STRL (DRAPES) ×3 IMPLANT
LOOP CUTTING BIPOLAR 21FR (ELECTRODE) IMPLANT
MANIFOLD NEPTUNE II (INSTRUMENTS) IMPLANT
NDL SPNL 22GX3.5 QUINCKE BK (NEEDLE) IMPLANT
NEEDLE SPNL 22GX3.5 QUINCKE BK (NEEDLE) ×3 IMPLANT
PACK BASIN DAY SURGERY FS (CUSTOM PROCEDURE TRAY) ×3 IMPLANT
PAD DRESSING TELFA 3X8 NADH (GAUZE/BANDAGES/DRESSINGS) ×1 IMPLANT
PAD OB MATERNITY 4.3X12.25 (PERSONAL CARE ITEMS) ×3 IMPLANT
SYR CONTROL 10ML LL (SYRINGE) ×2 IMPLANT
TOWEL OR 17X24 6PK STRL BLUE (TOWEL DISPOSABLE) ×6 IMPLANT
TRAY DSU PREP LF (CUSTOM PROCEDURE TRAY) ×3 IMPLANT
TUBE CONNECTING 12'X1/4 (SUCTIONS)
TUBE CONNECTING 12X1/4 (SUCTIONS) IMPLANT
TUBING AQUILEX INFLOW (TUBING) ×3 IMPLANT
TUBING AQUILEX OUTFLOW (TUBING) ×3 IMPLANT
WATER STERILE IRR 500ML POUR (IV SOLUTION) ×3 IMPLANT

## 2017-05-30 NOTE — Anesthesia Postprocedure Evaluation (Signed)
Anesthesia Post Note  Patient: Joy Le  Procedure(s) Performed: Procedure(s) (LRB): DILATATION & CURETTAGE/HYSTEROSCOPY WITH NOVASURE ABLATION (N/A)     Patient location during evaluation: PACU Anesthesia Type: MAC Level of consciousness: awake and alert Pain management: pain level controlled Vital Signs Assessment: post-procedure vital signs reviewed and stable Respiratory status: spontaneous breathing, nonlabored ventilation, respiratory function stable and patient connected to nasal cannula oxygen Cardiovascular status: stable and blood pressure returned to baseline Anesthetic complications: no    Last Vitals:  Vitals:   05/30/17 1617 05/30/17 1630  BP: 131/67 134/66  Pulse:  77  Resp:  16  Temp: 36.6 C     Last Pain:  Vitals:   05/30/17 1302  TempSrc: Oral                 Ryan P Ellender

## 2017-05-30 NOTE — Progress Notes (Signed)
The patient was interviewed and examined today.  The previously documented history and physical examination was reviewed. There are no changes. The operative procedure was reviewed. The risks and benefits were outlined again. The specific risks include, but are not limited to, anesthetic complications, bleeding, infections, and possible damage to the surrounding organs. The patient's questions were answered.  We are ready to proceed as outlined. The likelihood of the patient achieving the goals of this procedure is very likely.   BP (!) 157/67   Pulse 76   Temp (!) 97.5 F (36.4 C) (Oral)   Resp 18   Ht 5' 2.5" (1.588 m)   Wt 123.4 kg (272 lb)   LMP 05/11/2017 (Exact Date)   SpO2 100%   BMI 48.96 kg/m   CBC    Component Value Date/Time   WBC 8.8 05/30/2017 1327   RBC 3.10 (L) 05/30/2017 1327   HGB 8.6 (L) 05/30/2017 1327   HCT 26.1 (L) 05/30/2017 1327   PLT 323 05/30/2017 1327   MCV 84.2 05/30/2017 1327   MCH 27.7 05/30/2017 1327   MCHC 33.0 05/30/2017 1327   RDW 14.5 05/30/2017 1327   LYMPHSABS 3.2 11/26/2012 1438   MONOABS 0.7 11/26/2012 1438   EOSABS 0.1 11/26/2012 1438   BASOSABS 0.0 11/26/2012 1438     Leonard SchwartzArthur Vernon Tracen Mahler, M.D. 05/30/2017

## 2017-05-30 NOTE — Discharge Instructions (Signed)
Endometrial Ablation Endometrial ablation is a procedure that destroys the thin inner layer of the lining of the uterus (endometrium). This procedure may be done:  To stop heavy periods.  To stop bleeding that is causing anemia.  To control irregular bleeding.  To treat bleeding caused by small tumors (fibroids) in the endometrium.  This procedure is often an alternative to major surgery, such as removal of the uterus and cervix (hysterectomy). As a result of this procedure:  You may not be able to have children. However, if you are premenopausal (you have not gone through menopause): ? You may still have a small chance of getting pregnant. ? You will need to use a reliable method of birth control after the procedure to prevent pregnancy.  You may stop having a menstrual period, or you may have only a small amount of bleeding during your period. Menstruation may return several years after the procedure.  Tell a health care provider about:  Any allergies you have.  All medicines you are taking, including vitamins, herbs, eye drops, creams, and over-the-counter medicines.  Any problems you or family members have had with the use of anesthetic medicines.  Any blood disorders you have.  Any surgeries you have had.  Any medical conditions you have. What are the risks? Generally, this is a safe procedure. However, problems may occur, including:  A hole (perforation) in the uterus or bowel.  Infection of the uterus, bladder, or vagina.  Bleeding.  Damage to other structures or organs.  An air bubble in the lung (air embolus).  Problems with pregnancy after the procedure.  Failure of the procedure.  Decreased ability to diagnose cancer in the endometrium.  What happens before the procedure?  You will have tests of your endometrium to make sure there are no pre-cancerous cells or cancer cells present.  You may have an ultrasound of the uterus.  You may be given  medicines to thin the endometrium.  Ask your health care provider about: ? Changing or stopping your regular medicines. This is especially important if you take diabetes medicines or blood thinners. ? Taking medicines such as aspirin and ibuprofen. These medicines can thin your blood. Do not take these medicines before your procedure if your doctor tells you not to.  Plan to have someone take you home from the hospital or clinic. What happens during the procedure?  You will lie on an exam table with your feet and legs supported as in a pelvic exam.  To lower your risk of infection: ? Your health care team will wash or sanitize their hands and put on germ-free (sterile) gloves. ? Your genital area will be washed with soap.  An IV tube will be inserted into one of your veins.  You will be given a medicine to help you relax (sedative).  A surgical instrument with a light and camera (resectoscope) will be inserted into your vagina and moved into your uterus. This allows your surgeon to see inside your uterus.  Endometrial tissue will be removed using one of the following methods: ? Radiofrequency. This method uses a radiofrequency-alternating electric current to remove the endometrium. ? Cryotherapy. This method uses extreme cold to freeze the endometrium. ? Heated-free liquid. This method uses a heated saltwater (saline) solution to remove the endometrium. ? Microwave. This method uses high-energy microwaves to heat up the endometrium and remove it. ? Thermal balloon. This method involves inserting a catheter with a balloon tip into the uterus. The balloon tip is  filled with heated fluid to remove the endometrium. The procedure may vary among health care providers and hospitals. What happens after the procedure?  Your blood pressure, heart rate, breathing rate, and blood oxygen level will be monitored until the medicines you were given have worn off.  As tissue healing occurs, you may  notice vaginal bleeding for 4-6 weeks after the procedure. You may also experience: ? Cramps. ? Thin, watery vaginal discharge that is light pink or brown in color. ? A need to urinate more frequently than usual. ? Nausea.  Do not drive for 24 hours if you were given a sedative.  Do not have sex or insert anything into your vagina until your health care provider approves. Summary  Endometrial ablation is done to treat the many causes of heavy menstrual bleeding.  The procedure may be done only after medications have been tried to control the bleeding.  Plan to have someone take you home from the hospital or clinic. This information is not intended to replace advice given to you by your health care provider. Make sure you discuss any questions you have with your health care provider. Document Released: 09/02/2004 Document Revised: 11/10/2016 Document Reviewed: 11/10/2016 Elsevier Interactive Patient Education  2017 Elsevier Inc. Dilation and Curettage or Vacuum Curettage, Care After These instructions give you information about caring for yourself after your procedure. Your doctor may also give you more specific instructions. Call your doctor if you have any problems or questions after your procedure. Follow these instructions at home: Activity  Do not drive or use heavy machinery while taking prescription pain medicine.  For 24 hours after your procedure, avoid driving.  Take short walks often, followed by rest periods. Ask your doctor what activities are safe for you. After one or two days, you may be able to return to your normal activities.  Do not lift anything that is heavier than 10 lb (4.5 kg) until your doctor approves.  For at least 2 weeks, or as long as told by your doctor: ? Do not douche. ? Do not use tampons. ? Do not have sex. General instructions  Take over-the-counter and prescription medicines only as told by your doctor. This is very important if you take  blood thinning medicine.  Do not take baths, swim, or use a hot tub until your doctor approves. Take showers instead of baths.  Wear compression stockings as told by your doctor.  It is up to you to get the results of your procedure. Ask your doctor when your results will be ready.  Keep all follow-up visits as told by your doctor. This is important. Contact a doctor if:  You have very bad cramps that get worse or do not get better with medicine.  You have very bad pain in your belly (abdomen).  You cannot drink fluids without throwing up (vomiting).  You get pain in a different part of the area between your belly and thighs (pelvis).  You have bad-smelling discharge from your vagina.  You have a rash. Get help right away if:  You are bleeding a lot from your vagina. A lot of bleeding means soaking more than one sanitary pad in an hour, for 2 hours in a row.  You have clumps of blood (blood clots) coming from your vagina.  You have a fever or chills.  Your belly feels very tender or hard.  You have chest pain.  You have trouble breathing.  You cough up blood.  You feel dizzy.  You feel light-headed.  You pass out (faint).  You have pain in your neck or shoulder area. Summary  Take short walks often, followed by rest periods. Ask your doctor what activities are safe for you. After one or two days, you may be able to return to your normal activities.  Do not lift anything that is heavier than 10 lb (4.5 kg) until your doctor approves.  Do not take baths, swim, or use a hot tub until your doctor approves. Take showers instead of baths.  Contact your doctor if you have any symptoms of infection, like bad-smelling discharge from your vagina. This information is not intended to replace advice given to you by your health care provider. Make sure you discuss any questions you have with your health care provider. Document Released: 08/02/2008 Document Revised: 07/11/2016  Document Reviewed: 07/11/2016 Elsevier Interactive Patient Education  2017 Elsevier Inc.   Post Anesthesia Home Care Instructions  Activity: Get plenty of rest for the remainder of the day. A responsible individual must stay with you for 24 hours following the procedure.  For the next 24 hours, DO NOT: -Drive a car -Advertising copywriterperate machinery -Drink alcoholic beverages -Take any medication unless instructed by your physician -Make any legal decisions or sign important papers.  Meals: Start with liquid foods such as gelatin or soup. Progress to regular foods as tolerated. Avoid greasy, spicy, heavy foods. If nausea and/or vomiting occur, drink only clear liquids until the nausea and/or vomiting subsides. Call your physician if vomiting continues.  Special Instructions/Symptoms: Your throat may feel dry or sore from the anesthesia or the breathing tube placed in your throat during surgery. If this causes discomfort, gargle with warm salt water. The discomfort should disappear within 24 hours.  If you had a scopolamine patch placed behind your ear for the management of post- operative nausea and/or vomiting:  1. The medication in the patch is effective for 72 hours, after which it should be removed.  Wrap patch in a tissue and discard in the trash. Wash hands thoroughly with soap and water. 2. You may remove the patch earlier than 72 hours if you experience unpleasant side effects which may include dry mouth, dizziness or visual disturbances. 3. Avoid touching the patch. Wash your hands with soap and water after contact with the patch.

## 2017-05-30 NOTE — Op Note (Signed)
OPERATIVE NOTE  Joy Le  DOB:    [birth year]  MRN:    [id number]  CSN:    [id number]  Date of Surgery:  05/30/2017  Preoperative Diagnosis:  Menorrhagia Anemia BMI equals 48 Hypertension Diabetes Atrial fibrillation  Postoperative Diagnosis:  Same  Procedure:  Hysteroscopy Dilatation and curettage NovaSure ablation of the endometrium  Surgeon:  Gildardo Cranker, M.D.  Assistant:  None  Anesthetic:  General  Disposition:  The patient is a 41 y.o.-year-old female who presents with menorrhagia and a hemoglobin of 8.2. The patient has atrial fibrillation and was placed on anticoagulants. Her menstrual cycles became even heavier. She has had a tubal sterilization procedure.. She understands the indications for her surgical procedure. She accepts the risk of, but not limited to, anesthetic complications, bleeding, infections, and possible damage to the surrounding organs.  Findings:  On examination under anesthesia the uterus was 10-12 week size. No adnexal masses were appreciated. No parametrial disease was appreciated. The uterus sounded to 11 cm. The patient was noted to have a thick endometrium, but no polyps or masses were present.  Procedure:  The patient was taken to the operating room where a MAC was given. The perineum and vagina were prepped with Betadine. The bladder was drained of urine. The patient was sterilely draped. Examination under anesthesia was performed. A paracervical block was placed using 10 cc of half percent Marcaine with epinephrine. An endocervical curettage was performed. The cervix was gently dilated. The diagnostic hysteroscope was inserted and the cavity was carefully inspected. Pictures were taken. Findings included: A thick endometrium but no masses were present. The diagnostic hysteroscope was removed. The cavity was then curetted using a sharp curet. The cavity was felt to be clean at the end of our procedure. The NovaSure  instrument was tested and was felt to be functioning properly. The NovaSure instrument was inserted inside the uterus. The sounding length was 11 7 m. The cervical length was 5 cm. The cavity length was 6 cm. The cavity width was 5.17 m. The power was set at 102 W. A test procedure was performed and the integrity of the uterine cavity was confirmed. The cavity was then ablated for 1 minute and 14 seconds. The patient tolerated her procedure well. Hemostasis was adequate. All instruments were removed. The examination was repeated and the uterus was noted to be firm. Sponge, and needle counts were correct. The estimated blood loss for the procedure was 5 cc. The estimated fluid deficit loss minimal. The patient was awakened from her anesthetic without difficulty. She was returned to the supine position and and transported to the recovery room in stable condition. The endocervical curettings, and endometrial curettings were sent to pathology.  Followup instructions:  The patient will return to see Dr. Raphael Gibney in 2 weeks. She was given a copy of the postoperative instructions for patients who've undergone hysteroscopy.  Discharge medications:  Motrin 800 mg every 8 hours as needed for mild to moderate pain. Percocet one tablet every 4 hours as needed for severe pain.  Gildardo Cranker, M.D.  05/30/2017 4:15 PM

## 2017-05-30 NOTE — Transfer of Care (Signed)
Immediate Anesthesia Transfer of Care Note  Patient: Silvano BilisShanta M Coor  Procedure(s) Performed: Procedure(s): DILATATION & CURETTAGE/HYSTEROSCOPY WITH NOVASURE ABLATION (N/A)  Patient Location: PACU  Anesthesia Type:General  Level of Consciousness: awake, alert  and oriented  Airway & Oxygen Therapy: Patient Spontanous Breathing and Patient connected to nasal cannula oxygen  Post-op Assessment: Report given to RN and Post -op Vital signs reviewed and stable  Post vital signs: Reviewed and stable  Last Vitals: 131/67, 75, 18, 98, 97.8 Vitals:   05/30/17 1302  BP: (!) 157/67  Pulse: 76  Resp: 18  Temp: (!) 36.4 C    Last Pain:  Vitals:   05/30/17 1302  TempSrc: Oral      Patients Stated Pain Goal: 8 (05/30/17 1324)  Complications: No apparent anesthesia complications

## 2017-05-30 NOTE — Anesthesia Preprocedure Evaluation (Addendum)
Anesthesia Evaluation  Patient identified by MRN, date of birth, ID band Patient awake    Reviewed: Allergy & Precautions, NPO status , Patient's Chart, lab work & pertinent test results, reviewed documented beta blocker date and time   Airway Mallampati: III  TM Distance: >3 FB Neck ROM: Full    Dental  (+) Teeth Intact, Dental Advisory Given   Pulmonary neg pulmonary ROS,    Pulmonary exam normal        Cardiovascular hypertension, Pt. on medications and Pt. on home beta blockers Normal cardiovascular exam+ dysrhythmias Atrial Fibrillation  Rhythm:Regular Rate:Normal  ECG: SR, rate 77. 1st degree AV block  Sees cardiologist   Neuro/Psych negative neurological ROS  negative psych ROS   GI/Hepatic   Endo/Other  diabetes, Well Controlled, Type 2, Oral Hypoglycemic AgentsMorbid obesity  Renal/GU      Musculoskeletal   Abdominal (+) + obese,   Peds  Hematology  (+) anemia ,   Anesthesia Other Findings   Reproductive/Obstetrics                            Anesthesia Physical  Anesthesia Plan  ASA: III  Anesthesia Plan: General   Post-op Pain Management:    Induction: Intravenous  PONV Risk Score and Plan: 3 and Ondansetron, Dexamethasone, Propofol and Midazolam  Airway Management Planned: Oral ETT  Additional Equipment:   Intra-op Plan:   Post-operative Plan: Extubation in OR  Informed Consent: I have reviewed the patients History and Physical, chart, labs and discussed the procedure including the risks, benefits and alternatives for the proposed anesthesia with the patient or authorized representative who has indicated his/her understanding and acceptance.   Dental advisory given  Plan Discussed with: CRNA and Surgeon  Anesthesia Plan Comments:        Anesthesia Quick Evaluation

## 2017-05-30 NOTE — Anesthesia Procedure Notes (Signed)
Procedure Name: MAC Date/Time: 05/30/2017 3:29 PM Performed by: Bethena Roys T Pre-anesthesia Checklist: Patient identified, Timeout performed, Emergency Drugs available, Suction available and Patient being monitored Patient Re-evaluated:Patient Re-evaluated prior to induction Oxygen Delivery Method: Nasal cannula Placement Confirmation: positive ETCO2

## 2017-05-31 ENCOUNTER — Encounter (HOSPITAL_BASED_OUTPATIENT_CLINIC_OR_DEPARTMENT_OTHER): Payer: Self-pay | Admitting: Obstetrics and Gynecology

## 2017-06-02 ENCOUNTER — Telehealth: Payer: Self-pay | Admitting: Pharmacist Clinician (PhC)/ Clinical Pharmacy Specialist

## 2017-06-02 NOTE — Telephone Encounter (Signed)
Patient called to report had D&C on Tuesday because of continued vaginal bleeding.  Now having some spotting, which she was told to expect.   Has not yet re-started Eliquis.  Advised she re-start that today or tomorrow at the latest and let us know if bleeding becomes a problem again.

## 2017-06-08 NOTE — Telephone Encounter (Signed)
Looking up telephone number.   AVS 

## 2018-03-27 ENCOUNTER — Ambulatory Visit: Payer: BC Managed Care – PPO | Admitting: Cardiovascular Disease

## 2018-03-27 ENCOUNTER — Encounter

## 2018-04-07 ENCOUNTER — Other Ambulatory Visit: Payer: Self-pay | Admitting: Cardiovascular Disease

## 2018-04-11 ENCOUNTER — Other Ambulatory Visit: Payer: Self-pay | Admitting: Pharmacist Clinician (PhC)/ Clinical Pharmacy Specialist

## 2018-04-11 MED ORDER — APIXABAN 5 MG PO TABS: 5.0000 mg | ORAL_TABLET | Freq: Two times a day (BID) | ORAL | 0 refills | Status: DC

## 2018-04-16 ENCOUNTER — Other Ambulatory Visit: Payer: Self-pay | Admitting: Cardiovascular Disease

## 2018-04-30 ENCOUNTER — Encounter: Payer: Self-pay | Admitting: Cardiology

## 2018-04-30 ENCOUNTER — Ambulatory Visit: Payer: BC Managed Care – PPO | Admitting: Cardiology

## 2018-04-30 DIAGNOSIS — I10 Essential (primary) hypertension: Secondary | ICD-10-CM

## 2018-04-30 DIAGNOSIS — E119 Type 2 diabetes mellitus without complications: Secondary | ICD-10-CM | POA: Diagnosis not present

## 2018-04-30 DIAGNOSIS — I480 Paroxysmal atrial fibrillation: Secondary | ICD-10-CM

## 2018-04-30 DIAGNOSIS — Z8679 Personal history of other diseases of the circulatory system: Secondary | ICD-10-CM

## 2018-04-30 DIAGNOSIS — Q233 Congenital mitral insufficiency: Secondary | ICD-10-CM

## 2018-04-30 DIAGNOSIS — Z7901 Long term (current) use of anticoagulants: Secondary | ICD-10-CM

## 2018-04-30 MED ORDER — LISINOPRIL 10 MG PO TABS: 10.0000 mg | ORAL_TABLET | Freq: Every day | ORAL | 3 refills | Status: DC

## 2018-04-30 NOTE — Assessment & Plan Note (Signed)
Mild to moderate MR on echo 2012 No symptoms but I could hear MR on exam (not previously noted)- will check an echo

## 2018-04-30 NOTE — Assessment & Plan Note (Signed)
Paroxysmal, s/p cardioversion 01/2011 No recurrence by her history

## 2018-04-30 NOTE — Progress Notes (Signed)
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 She is married with two children and she is working on her doctorate.  She has a history of ostium primum atrial septal defect and congenital mitral valve insufficiency repaired surgically at age 89. She has also a history of PAF (non valvular per Dr Royann Shiversroitoru) and is on Eliquis, HTN, morbid obesity, NIDDM, and mild to moderate MR on echo in 2012. She is in the office today for yearly check. She has done well over the past year. She did have GYN surgery in July and tolerated this well. She is morbidly obese- BMI 50. She denies any symptoms of sleep apnea. She has not had unusual dyspnea, orthopnea, or palpitations.    Current Outpatient Medications  Medication Sig Dispense Refill  . apixaban (ELIQUIS) 5 MG TABS tablet Take 1 tablet (5 mg total) by mouth 2 (two) times daily. Follow up with cardiologist prior to next refill authorization. 60 tablet 0  . CARTIA XT 120 MG 24 hr capsule TAKE 1 CAPSULE BY MOUTH ONCE DAILY 90 capsule 0  . CINNAMON PO Take 2 capsules by mouth every morning.     . Cyanocobalamin (B-12) 1000 MCG SUBL Place under the tongue daily.    Marland Kitchen. JANUVIA 100 MG tablet Take 1 tablet by mouth every morning.  3  . meloxicam (MOBIC) 15 MG tablet Take 1 tablet by mouth every morning.    . metFORMIN (GLUCOPHAGE) 500 MG tablet Take 1,000 mg by mouth 2 (two) times daily with a meal.    . metoprolol succinate (TOPROL-XL) 50 MG 24 hr tablet TAKE 1 TABLET BY MOUTH ONCE DAILY 90 tablet 0  . lisinopril (PRINIVIL,ZESTRIL) 10 MG tablet Take 1 tablet (10 mg total) by mouth daily. 90 tablet 3   No current facility-administered medications for this visit.     Allergies  Allergen Reactions  . Codeine Other (See Comments)    Hallucination    Past Medical History:  Diagnosis Date    . Anticoagulant long-term use    Eliquis  . Complication of anesthesia    slow to wake  . First degree heart block   . Heart murmur    congenital --  per dr croitoru (cardiologist) note murmur not easily audible on exam and did not have evidence of left ventricular dilation  . History of gestational diabetes 2008  . History of kidney stones   . History of repair of congenital atrial septal defect (ASD) 651984  age 89---  cardiologist- dr croitoru   repair ostium primum atrial septal defect (no residual effects per last echo, long AV conduction time consistant w/ previous ostium primum) associated cleft anterior mitral leaflet , repaired   . Iron deficiency anemia   . Menorrhagia with irregular cycle   . Mitral regurgitation    pt is s/p repair cleft anterior Mitral leaflet / per last echo 03-29-2011  mild to moderate MR w/ improvement since last echo 05/ 2011, now mostly mild  . Nephrolithiasis urologist-  dr Marlou Porchherrick   left side non-obstructive per Renal US 04/ 2017  . PAF (paroxysmal atrial fibrillation) [facility](HCC) cardiologist-  dr croitoru   DCCV 02-04-2011 successful (does note have valvular atrial fib.)  . Systemic hypertension   . Type 2 diabetes mellitus (HCC)     Social History  Socioeconomic History  . Marital status: Married    Spouse name: Glenola Wheat "BJ"  . Number of children: 1  . Years of education: 75  . Highest education level: Not on file  Occupational History  . Occupation: Magazine features editor: [employer]  Social Needs  . Financial resource strain: Not on file  . Food insecurity:    Worry: Not on file    Inability: Not on file  . Transportation needs:    Medical: Not on file    Non-medical: Not on file  Tobacco Use  . Smoking status: Never Smoker  . Smokeless tobacco: Never Used  Substance and Sexual Activity  . Alcohol use: No  . Drug use: No  . Sexual activity: Yes    Partners: Male    Birth control/protection: Surgical    Comment:  BTL  Lifestyle  . Physical activity:    Days per week: Not on file    Minutes per session: Not on file  . Stress: Not on file  Relationships  . Social connections:    Talks on phone: Not on file    Gets together: Not on file    Attends religious service: Not on file    Active member of club or organization: Not on file    Attends meetings of clubs or organizations: Not on file    Relationship status: Not on file  . Intimate partner violence:    Fear of current or ex partner: Not on file    Emotionally abused: Not on file    Physically abused: Not on file    Forced sexual activity: Not on file  Other Topics Concern  . Not on file  Social History Narrative  . Not on file     Family History  Problem Relation Age of Onset  . Lung cancer Paternal Grandfather   . Alcohol abuse Paternal Grandfather   . Diabetes Mother   . Stroke Mother   . Hypertension Mother   . Hypertension Father   . Diabetes Maternal Grandmother   . Anemia Sister   . Diabetes Maternal Aunt   . Diabetes Paternal Aunt   . Alzheimer's disease Maternal Grandfather   . Hypertension Sister      Review of Systems: General: negative for chills, fever, night sweats or weight changes.  Cardiovascular: negative for chest pain, dyspnea on exertion, edema, orthopnea, palpitations, paroxysmal nocturnal dyspnea or shortness of breath Dermatological: negative for rash Respiratory: negative for cough or wheezing Urologic: negative for hematuria Abdominal: negative for nausea, vomiting, diarrhea, bright red blood per rectum, melena, or hematemesis Neurologic: negative for visual changes, syncope, or dizziness All other systems reviewed and are otherwise negative except as noted above.    Blood pressure 140/82, pulse 70, height 5\' 2"  (1.575 m), weight 278 lb (126.1 kg).  General appearance: alert, cooperative, no distress and morbidly obese Neck: no carotid bruit and no JVD Lungs: clear to auscultation  bilaterally Heart: regular rate and rhythm and 1-2/6 MR murmur  Extremities: extremities normal, atraumatic, no cyanosis or edema Skin: Skin color, texture, turgor normal. No rashes or lesions Neurologic: Grossly normal  EKG NSR-70, 1st AVB-PR 264  ASSESSMENT AND PLAN:   Essential hypertension Uncontrolled- B/P by me 144/76. Add Lisinopril 10 mg Check BMP and B/P when she comes in for an echo  Mitral valve insufficiency, congenital Mild to moderate MR on echo 2012 No symptoms but I could hear MR on exam (not previously noted)- will check an  echo  PAF (paroxysmal atrial fibrillation) (HCC) Paroxysmal, s/p cardioversion 01/2011 No recurrence by her history  Chronic anticoagulation CHADS VASC 3- sex, DM, HTN On Eliquis  History of atrial septal defect Hx surgical correction at age 46  Non-insulin dependent type 2 diabetes mellitus (HCC) Followed by PCP. Will add ACE for HTN  Morbid obesity (HCC) BMI-50. Insignificant sleep apnea on sleep study 2012 and she denies any symptoms of sleep apnea.    PLAN  She has some MR on exam, previously this was not appreciated. Her last echo was 7 years ago- will repeat this. I also added Lisinopril 10 mg. She has DM and repeat B/P by me was high. The pt admitted her B/P at home consistently runs 130-140 systolic. I'll check her BMP and B/P when she comes in for her echo. We discussed the importance of weight loss. F/U in a year with Dr Royann Shivers.   Corine Shelter PA-C 04/30/2018 8:50 AM

## 2018-04-30 NOTE — Assessment & Plan Note (Signed)
BMI-50. Insignificant sleep apnea on sleep study 2012 and she denies any symptoms of sleep apnea.

## 2018-04-30 NOTE — Assessment & Plan Note (Signed)
Uncontrolled- B/P by me 144/76. Add Lisinopril 10 mg Check BMP and B/P when she comes in for an echo

## 2018-04-30 NOTE — Assessment & Plan Note (Signed)
Hx surgical correction at age 42

## 2018-04-30 NOTE — Assessment & Plan Note (Signed)
Followed by PCP. Will add ACE for HTN

## 2018-04-30 NOTE — Assessment & Plan Note (Signed)
CHADS VASC 3- sex, DM, HTN On Eliquis

## 2018-04-30 NOTE — Patient Instructions (Signed)
Medication Instructions: START Lisinopril 10 mg daily  If you need a refill on your cardiac medications before your next appointment, please call your pharmacy.   Labwork: Your provider would like for you to return when you have the ECHO to have the following labs drawn: BMET. You do not need an appointment for the lab. Once in our office lobby there is a podium where you can sign in and ring the doorbell to alert us that you are here. The lab is open from 8:00 am to 4:30 pm; closed for lunch from 12:45pm-1:45pm.   Procedures/Testing: Your physician has requested that you have an echocardiogram. Echocardiography is a painless test that uses sound waves to create images of your heart. It provides your doctor with information about the size and shape of your heart and how well your heart's chambers and valves are working. This procedure takes approximately one hour. There are no restrictions for this procedure. This will take place at [address], [address].  Follow-Up: Your physician wants you to follow-up in 12 months with Dr. Royann Shiversroitoru. You will receive a reminder letter in the mail two months in advance. If you don't receive a letter, please call our office at [phone] to schedule this follow-up appointment.   Thank you for choosing [facility] at [facility]!!

## 2018-05-08 ENCOUNTER — Other Ambulatory Visit: Payer: BC Managed Care – PPO | Admitting: *Deleted

## 2018-05-08 ENCOUNTER — Ambulatory Visit (HOSPITAL_COMMUNITY): Payer: BC Managed Care – PPO | Attending: Cardiovascular Disease

## 2018-05-08 ENCOUNTER — Other Ambulatory Visit: Payer: Self-pay

## 2018-05-08 DIAGNOSIS — I119 Hypertensive heart disease without heart failure: Secondary | ICD-10-CM | POA: Insufficient documentation

## 2018-05-08 DIAGNOSIS — Q233 Congenital mitral insufficiency: Secondary | ICD-10-CM | POA: Diagnosis not present

## 2018-05-08 DIAGNOSIS — I4891 Unspecified atrial fibrillation: Secondary | ICD-10-CM | POA: Insufficient documentation

## 2018-05-08 DIAGNOSIS — E119 Type 2 diabetes mellitus without complications: Secondary | ICD-10-CM | POA: Diagnosis not present

## 2018-05-08 DIAGNOSIS — I10 Essential (primary) hypertension: Secondary | ICD-10-CM | POA: Diagnosis present

## 2018-05-08 LAB — BASIC METABOLIC PANEL
BUN/Creatinine Ratio: 20 (ref 9–23)
BUN: 10 mg/dL (ref 6–24)
CO2: 20 mmol/L (ref 20–29)
Calcium: 9.3 mg/dL (ref 8.7–10.2)
Chloride: 103 mmol/L (ref 96–106)
Creatinine, Ser: 0.5 mg/dL — ABNORMAL LOW (ref 0.57–1.00)
GFR calc Af Amer: 140 mL/min/{1.73_m2} (ref >59)
GFR calc non Af Amer: 121 mL/min/{1.73_m2} (ref >59)
Glucose: 82 mg/dL (ref 65–99)
Potassium: 4.2 mmol/L (ref 3.5–5.2)
Sodium: 138 mmol/L (ref 134–144)

## 2018-06-10 ENCOUNTER — Other Ambulatory Visit: Payer: Self-pay | Admitting: Cardiovascular Disease

## 2018-07-12 ENCOUNTER — Ambulatory Visit: Payer: BC Managed Care – PPO | Admitting: Cardiovascular Disease

## 2018-07-16 ENCOUNTER — Other Ambulatory Visit: Payer: Self-pay | Admitting: Cardiovascular Disease

## 2018-09-13 ENCOUNTER — Encounter: Payer: Self-pay | Admitting: Cardiovascular Disease

## 2018-09-13 ENCOUNTER — Ambulatory Visit: Payer: BC Managed Care – PPO | Admitting: Cardiovascular Disease

## 2018-09-13 VITALS — BP 135/86 | HR 75 | Ht 62.5 in | Wt 276.6 lb

## 2018-09-13 DIAGNOSIS — I340 Nonrheumatic mitral (valve) insufficiency: Secondary | ICD-10-CM | POA: Diagnosis not present

## 2018-09-13 DIAGNOSIS — Z7901 Long term (current) use of anticoagulants: Secondary | ICD-10-CM

## 2018-09-13 DIAGNOSIS — I10 Essential (primary) hypertension: Secondary | ICD-10-CM

## 2018-09-13 DIAGNOSIS — Z8774 Personal history of (corrected) congenital malformations of heart and circulatory system: Secondary | ICD-10-CM | POA: Diagnosis not present

## 2018-09-13 DIAGNOSIS — I480 Paroxysmal atrial fibrillation: Secondary | ICD-10-CM

## 2018-09-13 DIAGNOSIS — N200 Calculus of kidney: Secondary | ICD-10-CM

## 2018-09-13 MED ORDER — LOSARTAN POTASSIUM 50 MG PO TABS: 50.0000 mg | ORAL_TABLET | Freq: Every day | ORAL | 3 refills | Status: DC

## 2018-09-13 MED ORDER — METOPROLOL SUCCINATE ER 50 MG PO TB24: 50.0000 mg | ORAL_TABLET | Freq: Every day | ORAL | 3 refills | Status: DC

## 2018-09-13 MED ORDER — DILTIAZEM HCL ER COATED BEADS 120 MG PO CP24: 120.0000 mg | ORAL_CAPSULE | Freq: Every day | ORAL | 3 refills | Status: DC

## 2018-09-13 MED ORDER — APIXABAN 5 MG PO TABS: 5.0000 mg | ORAL_TABLET | Freq: Two times a day (BID) | ORAL | 1 refills | Status: DC

## 2018-09-13 NOTE — Patient Instructions (Signed)
Medication Instructions:  Dr Royann Shivers has recommended making the following medication changes: 1. STOP Lisinopril 2. START Losartan 50 mg - take 1 tablet daily  Your physician has requested that you regularly monitor your blood pressure at home. Please use the same machine to check your blood pressure daily. Keep a record of your blood pressures using the log sheet provided. In 2 weeks, please report your readings back to Dr C. You may use our online patient portal 'MyChart,' you can call the office to speak with a nurse, or fax them to the office.  If you need a refill on your cardiac medications before your next appointment, please call your pharmacy.   Follow-Up: At [facility], you and your health needs are our priority.  As part of our continuing mission to provide you with exceptional heart care, we have created designated Provider Care Teams.  These Care Teams include your primary Cardiologist (physician) and Advanced Practice Providers (APPs -  Physician Assistants and Nurse Practitioners) who all work together to provide you with the care you need, when you need it. You will need a follow up appointment in 12 months.  Please call our office 2 months in advance to schedule this appointment.  You may see Thurmon Fair, MD or one of the following Advanced Practice Providers on your designated Care Team: [place], [place] . Micah Flesher, PA-C

## 2018-09-13 NOTE — Progress Notes (Signed)
Patient ID: Joy Le, female   DOB: 08/19/1976, 42 y.o.   MRN: [id number]     Cardiology Office Note    Date:  09/15/2018   ID:  Joy Le, DOB [birth year], MRN [id number]  PCP:  Laqueta Due., MD  Cardiologist:   Thurmon Fair, MD   Chief Complaint  Patient presents with  . Follow-up    Adult congenital heart disease  . Atrial Fibrillation    History of Present Illness:  Joy Le is a 42 y.o. female with a history of ostium primum atrial septal defect and congenital mitral valve insufficiency repaired surgically at age 11 who has had problems with intermittent paroxysmal atrial fibrillation.   Menorrhagia and hematuria (nephrolithiasis) has not been a problem recently.  She had an endometrial ablation in July 2018.  She is compliant with Eliquis.  Her youngest child is now 1 years old.  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   The patient specifically denies any chest pain at rest exertion, dyspnea at rest or with exertion, orthopnea, paroxysmal nocturnal dyspnea, syncope, palpitations, focal neurological deficits, intermittent claudication, lower extremity edema, unexplained weight gain,  hemoptysis or wheezing.  Lisinopril added in June 2019 has worked well for her blood pressure medicine, but is causing a dry cough.  She has not managed to lose any length.  She is considering enrollment in a bariatric program.    Past Medical History:  Diagnosis Date  . Anticoagulant long-term use    Eliquis  . Complication of anesthesia    slow to wake  . First degree heart block   . Heart murmur    congenital --  per dr Vanessa Kampf (cardiologist) note murmur not easily audible on exam and did not have evidence of left ventricular dilation  . History of gestational diabetes 2008  . History of kidney stones   . History of repair of congenital atrial septal defect (ASD) 44  age 61---  cardiologist- dr Annita Ratliff   repair ostium primum atrial septal  defect (no residual effects per last echo, long AV conduction time consistant w/ previous ostium primum) associated cleft anterior mitral leaflet , repaired   . Iron deficiency anemia   . Menorrhagia with irregular cycle   . Mitral regurgitation    pt is s/p repair cleft anterior Mitral leaflet / per last echo 03-29-2011  mild to moderate MR w/ improvement since last echo 05/ 2011, now mostly mild  . Nephrolithiasis urologist-  dr Marlou Porch   left side non-obstructive per Renal US 04/ 2017  . PAF (paroxysmal atrial fibrillation) [facility]) cardiologist-  dr Wafa Martes   DCCV 02-04-2011 successful (does note have valvular atrial fib.)  . Systemic hypertension   . Type 2 diabetes mellitus (HCC)     Past Surgical History:  Procedure Laterality Date  . ASD REPAIR, OSTIUM PRIMUM  1984  age 71    [facility]   and repaired cleft anterior mitral valve leaflet defect (no prosthesis)  . CARDIAC CATHETERIZATION  04/27/2005   No sign. CAD,mod. MR,elevated LV end-diastolic pressure,EF 50% w/mild anterolateral hypokinesis  . CARDIOVASCULAR STRESS TEST  03/09/2005   Intermediate risk nuclear perfusion study w/ a primary fixed apical defect , represents either scar or marked apical thinning/  normal LV function and wall motion , ef 67%  . CARDIOVERSION  01/25/2011   successful  . CESAREAN SECTION  08/02/2007  . CESAREAN SECTION WITH BILATERAL TUBAL LIGATION Bilateral 07/04/2013   Procedure: REPEAT CESAREAN SECTION  WITH BILATERAL TUBAL LIGATION;  Surgeon: Kirkland Hun, MD;  Location: [facility];  Service: Obstetrics;  Laterality: Bilateral;  . CYSTOSCOPY/RETROGRADE/URETEROSCOPY/STONE EXTRACTION WITH BASKET Right 01/08/2016   Procedure: RIGHT URETEROSCOPY/LASER LITHOTRIPSY/STONE EXTRACTION WITH RETROGRADE PYELOGRAM AND RIGHT STENT PLACEMENT;  Surgeon: Crist Fat, MD;  Location: [facility];  Service: Urology;  Laterality: Right;  . DILITATION & CURRETTAGE/HYSTROSCOPY WITH NOVASURE ABLATION N/A 05/30/2017   Procedure:  DILATATION & CURETTAGE/HYSTEROSCOPY WITH NOVASURE ABLATION;  Surgeon: Kirkland Hun, MD;  Location: [facility];  Service: Gynecology;  Laterality: N/A;  . HOLMIUM LASER APPLICATION N/A 01/08/2016   Procedure: HOLMIUM LASER APPLICATION;  Surgeon: Crist Fat, MD;  Location: [facility];  Service: Urology;  Laterality: N/A;  . TRANSTHORACIC ECHOCARDIOGRAM  03-29-2011  dr Deloyce Walthers   mild concentricl LVH,  ef 60-65%,  E/A fusion due to 1st degree AVB, normal wall motion/ mild to moderate MR, posteriorly directed jet/  mild TR/  normal RVSP (compared to prior echo 05/ 2011 mild improvment in the degree MR from moderate to more likely mild)  . WISDOM TOOTH EXTRACTION      Outpatient Medications Prior to Visit  Medication Sig Dispense Refill  . CINNAMON PO Take 2 capsules by mouth every morning.     . Cyanocobalamin (B-12) 1000 MCG SUBL Place under the tongue daily.    Marland Kitchen JANUVIA 100 MG tablet Take 1 tablet by mouth every morning.  3  . meloxicam (MOBIC) 15 MG tablet Take 1 tablet by mouth every morning.    . metFORMIN (GLUCOPHAGE) 500 MG tablet Take 1,000 mg by mouth 2 (two) times daily with a meal.    . CARTIA XT 120 MG 24 hr capsule TAKE 1 CAPSULE BY MOUTH ONCE DAILY *PLEASE  SCHEDULE  AN  APPOINTMENT  FOR  FUTURE  REFILLS* 90 capsule 3  . ELIQUIS 5 MG TABS tablet TAKE 1 TABLET BY MOUTH TWICE DAILY . APPOINTMENT REQUIRED FOR FUTURE REFILLS 180 tablet 1  . lisinopril (PRINIVIL,ZESTRIL) 10 MG tablet Take 1 tablet (10 mg total) by mouth daily. 90 tablet 3  . metoprolol succinate (TOPROL-XL) 50 MG 24 hr tablet TAKE 1 TABLET BY MOUTH ONCE DAILY *PLEASE  SCHEDULE  AN  APPT  FOR  FUTURE  REFILLS* 90 tablet 3   No facility-administered medications prior to visit.      Allergies:   Codeine   Social History   Socioeconomic History  . Marital status: Married    Spouse name: Jamelia Varano "BJ"  . Number of children: 1  . Years of education: 90  . Highest education level: Not on  file  Occupational History  . Occupation: Magazine features editor: [employer]  Social Needs  . Financial resource strain: Not on file  . Food insecurity:    Worry: Not on file    Inability: Not on file  . Transportation needs:    Medical: Not on file    Non-medical: Not on file  Tobacco Use  . Smoking status: Never Smoker  . Smokeless tobacco: Never Used  Substance and Sexual Activity  . Alcohol use: No  . Drug use: No  . Sexual activity: Yes    Partners: Male    Birth control/protection: Surgical    Comment: BTL  Lifestyle  . Physical activity:    Days per week: Not on file    Minutes per session: Not on file  . Stress: Not on file  Relationships  . Social connections:    Talks  on phone: Not on file    Gets together: Not on file    Attends religious service: Not on file    Active member of club or organization: Not on file    Attends meetings of clubs or organizations: Not on file    Relationship status: Not on file  Other Topics Concern  . Not on file  Social History Narrative  . Not on file     Family History:  The patient's family history includes Alcohol abuse in her paternal grandfather; Alzheimer's disease in her maternal grandfather; Anemia in her sister; Diabetes in her maternal aunt, maternal grandmother, mother, and paternal aunt; Hypertension in her father, mother, and sister; Lung cancer in her paternal grandfather; Stroke in her mother.   ROS:   Please see the history of present illness.    ROS All other systems reviewed and are negative.   PHYSICAL EXAM:   VS:  BP (!) 160/88   Pulse 75   Ht 5' 2.5" (1.588 m)   Wt 276 lb 9.6 oz (125.5 kg)   BMI 49.78 kg/m      General: Alert, oriented x3, no distress, morbidly obese Head: no evidence of trauma, PERRL, EOMI, no exophtalmos or lid lag, no myxedema, no xanthelasma; normal ears, nose and oropharynx Neck: normal jugular venous pulsations and no hepatojugular reflux; brisk carotid pulses  without delay and no carotid bruits Chest: clear to auscultation, no signs of consolidation by percussion or palpation, normal fremitus, symmetrical and full respiratory excursions Cardiovascular: normal position and quality of the apical impulse, regular rhythm, normal first and second heart sounds, no murmurs, rubs or gallops Abdomen: no tenderness or distention, no masses by palpation, no abnormal pulsatility or arterial bruits, normal bowel sounds, no hepatosplenomegaly Extremities: no clubbing, cyanosis or edema; 2+ radial, ulnar and brachial pulses bilaterally; 2+  posterior tibial and dorsalis pedis pulses; 2+ posterior tibial and dorsalis pedis pulses Neurological: grossly nonfocal Psych: Normal mood and affect   Wt Readings from Last 3 Encounters:  09/13/18 276 lb 9.6 oz (125.5 kg)  04/30/18 278 lb (126.1 kg)  05/30/17 272 lb (123.4 kg)    Studies/Labs Reviewed:   EKG:  EKG is ordered today.  The ekg ordered today shows normal sinus rhythm with prolonged PR interval (264 ms), nonspecific intraventricular conduction delay QRS right at 120 ms, mostly resembling left bundle branch block, QTC 424 ms  ASSESSMENT:    1. Essential hypertension      PLAN:  In order of problems listed above:  1. AFib: Asymptomatic. CHADSVasc 2 (HTN, gender) and history of previous cardiac surgery.  2. Eliquis: No further bleeding problems especially after undergoing endometrial ablation. 3. MR: Mild to moderate mitral regurgitation on remote echo, only mild on 2019 echo. No evidence of left ventricular dilation.  4. ASD: No evidence of residual defect by last echocardiogram. Long AV conduction time consistent with previous ostium primum.  5. HTN: switch ACEi to ARB due to cough. 6. Obesity: She is approaching super obesity, I encouraged her to go to participate in a structured bariatric program and encouraged more physical activity. 7. Nephrolithiasis: Previously complicated by   Medication  Adjustments/Labs and Tests Ordered: Current medicines are reviewed at length with the patient today.  Concerns regarding medicines are outlined above.  Medication changes, Labs and Tests ordered today are listed in the Patient Instructions below. Patient Instructions  Medication Instructions:  Dr Royann Shivers has recommended making the following medication changes: 1. STOP Lisinopril 2. START Losartan 50  mg - take 1 tablet daily  Your physician has requested that you regularly monitor your blood pressure at home. Please use the same machine to check your blood pressure daily. Keep a record of your blood pressures using the log sheet provided. In 2 weeks, please report your readings back to Dr C. You may use our online patient portal 'MyChart,' you can call the office to speak with a nurse, or fax them to the office.  If you need a refill on your cardiac medications before your next appointment, please call your pharmacy.   Follow-Up: At [facility], you and your health needs are our priority.  As part of our continuing mission to provide you with exceptional heart care, we have created designated Provider Care Teams.  These Care Teams include your primary Cardiologist (physician) and Advanced Practice Providers (APPs -  Physician Assistants and Nurse Practitioners) who all work together to provide you with the care you need, when you need it. You will need a follow up appointment in 12 months.  Please call our office 2 months in advance to schedule this appointment.  You may see Thurmon Fair, MD or one of the following Advanced Practice Providers on your designated Care Team: [place], [place] . Micah Flesher, PA-C      Signed, Thurmon Fair, MD  09/15/2018 11:03 AM    [facility] [address] [place], [place], [place]  [address] Phone: [phone]; Fax: [phone]

## 2018-09-15 ENCOUNTER — Encounter: Payer: Self-pay | Admitting: Cardiovascular Disease

## 2019-06-10 ENCOUNTER — Other Ambulatory Visit: Payer: Self-pay | Admitting: Cardiovascular Disease

## 2019-06-10 NOTE — Telephone Encounter (Signed)
89f 125.5kg Scr 0.50 lovcroitoru 09/13/18

## 2019-07-08 ENCOUNTER — Other Ambulatory Visit: Payer: Self-pay | Admitting: Cardiovascular Disease

## 2019-07-08 NOTE — Telephone Encounter (Signed)
Pt is a 43 yr old female who last saw Dr. Sallyanne Kuster on 09/13/18 has recall for 09/2019. Last weight at that visit was 125.5Kg. SCr on 05/08/18 was 0.5. Will refill Eliquis for 59mths until f/u and labs with Dr. Sallyanne Kuster.

## 2019-07-08 NOTE — Telephone Encounter (Signed)
Please review for refill. Thank you! 

## 2019-07-08 NOTE — Telephone Encounter (Signed)
58f 125.5kg Scr 0.50 05/08/18 Lov/croitoru 09/13/18

## 2019-08-22 ENCOUNTER — Telehealth: Payer: Self-pay | Admitting: Cardiovascular Disease

## 2019-08-22 NOTE — Telephone Encounter (Signed)
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 She denies any problems and this is related to covid and the possible risk of her contracting it from the children. She has a follow up appointment 09-27-2019 but needs the note asap the children return on Monday. She will come by the office to pick up the note when ready. Will forward to dr croitoru to review and advise

## 2019-08-22 NOTE — Telephone Encounter (Signed)
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

## 2019-08-26 ENCOUNTER — Encounter: Payer: Self-pay | Admitting: Cardiovascular Disease

## 2019-08-26 NOTE — Telephone Encounter (Signed)
Letter was taken to front desk for pt to pick up by [facility], LPN.

## 2019-08-26 NOTE — Telephone Encounter (Signed)
Letter is ready in epic. Copy in pod C, signed and ready for pickup

## 2019-09-23 ENCOUNTER — Other Ambulatory Visit: Payer: Self-pay | Admitting: Cardiovascular Disease

## 2019-09-27 ENCOUNTER — Ambulatory Visit: Payer: BC Managed Care – PPO | Admitting: Cardiovascular Disease

## 2019-09-27 ENCOUNTER — Encounter: Payer: Self-pay | Admitting: Cardiovascular Disease

## 2019-09-27 ENCOUNTER — Other Ambulatory Visit: Payer: Self-pay

## 2019-09-27 VITALS — BP 138/86 | HR 78 | Temp 97.5°F | Ht 62.5 in | Wt 281.2 lb

## 2019-09-27 DIAGNOSIS — E1169 Type 2 diabetes mellitus with other specified complication: Secondary | ICD-10-CM

## 2019-09-27 DIAGNOSIS — I340 Nonrheumatic mitral (valve) insufficiency: Secondary | ICD-10-CM

## 2019-09-27 DIAGNOSIS — Z7901 Long term (current) use of anticoagulants: Secondary | ICD-10-CM | POA: Diagnosis not present

## 2019-09-27 DIAGNOSIS — I480 Paroxysmal atrial fibrillation: Secondary | ICD-10-CM

## 2019-09-27 DIAGNOSIS — I10 Essential (primary) hypertension: Secondary | ICD-10-CM

## 2019-09-27 DIAGNOSIS — E669 Obesity, unspecified: Secondary | ICD-10-CM | POA: Diagnosis not present

## 2019-09-27 DIAGNOSIS — N200 Calculus of kidney: Secondary | ICD-10-CM

## 2019-09-27 DIAGNOSIS — Z8679 Personal history of other diseases of the circulatory system: Secondary | ICD-10-CM

## 2019-09-27 MED ORDER — LOSARTAN POTASSIUM 50 MG PO TABS: 50.0000 mg | ORAL_TABLET | Freq: Every day | ORAL | 3 refills | Status: DC

## 2019-09-27 NOTE — Progress Notes (Signed)
Patient ID: Joy Le, female   DOB: 1975-12-13, 43 y.o.   MRN: [id number]     Cardiology Office Note    Date:  09/27/2019   ID:  Joy Le [birth year], MRN [id number]  PCP:  Joy Le., MD  Cardiologist:   Joy Klein, MD   Chief Complaint  Patient presents with  . Cardiac Valve Problem    s/p ostium primum repair with residual MR  . Atrial Fibrillation    History of Present Illness:  Joy Le is a 43 y.o. female with a history of ostium primum atrial septal defect and congenital mitral valve insufficiency repaired surgically at age 57 who has had problems with intermittent paroxysmal atrial fibrillation.   She is doing quite well and has no cardiovascular complaints.The patient specifically denies any chest pain at rest exertion, dyspnea at rest or with exertion, orthopnea, paroxysmal nocturnal dyspnea, syncope, palpitations, focal neurological deficits, intermittent claudication, lower extremity edema, unexplained weight gain, cough, hemoptysis or wheezing.  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  She is concerned about her risk factors for adverse outcome during the current coronavirus pandemic.  She tells me that [place] has requested a letter summarizing her risk factors and I provided that for her today.  Since her endometrial ablation and the resolution of her menorrhagia her iron deficiency anemia has resolved.  Most recent labs continue to show a relatively low ferritin of 19, but Transferrin Saturation Is normal and her hemoglobin Is excellent: 12.9 with a normal MCV.  Although she recently mash lose some weight, unfortunately benign results over the last year is an increase in her overall weight into the super obese range with a BMI over 50.  Switch from lisinopril to losartan and her dry cough has resolved.  Recent labs from June show a hemoglobin A1c of  6.9% a generally favorable lipid profile with an LDL of 102 normal renal function.   Past Medical History:  Diagnosis Date  . Anticoagulant long-term use    Eliquis  . Complication of anesthesia    slow to wake  . First degree heart block   . Heart murmur    congenital --  per dr Joy Le (cardiologist) note murmur not easily audible on exam and did not have evidence of left ventricular dilation  . History of gestational diabetes 2008  . History of kidney stones   . History of repair of congenital atrial septal defect (ASD) 22  age 72---  cardiologist- dr Joy Le   repair ostium primum atrial septal defect (no residual effects per last echo, long AV conduction time consistant w/ previous ostium primum) associated cleft anterior mitral leaflet , repaired   . Iron deficiency anemia   . Menorrhagia with irregular cycle   . Mitral regurgitation    pt is s/p repair cleft anterior Mitral leaflet / per last echo 03-29-2011  mild to moderate MR w/ improvement since last echo 05/ 2011, now mostly mild  . Nephrolithiasis urologist-  dr Joy Le   left side non-obstructive per Renal US 04/ 2017  . PAF (paroxysmal atrial fibrillation) [facility]) cardiologist-  dr Joy Le   DCCV 02-04-2011 successful (does note have valvular atrial fib.)  . Systemic hypertension   . Type 2 diabetes mellitus ([place])     Past Surgical History:  Procedure Laterality Date  . ASD REPAIR, OSTIUM PRIMUM  72  age 56    [facility]  and repaired cleft anterior mitral valve leaflet defect (no prosthesis)  . CARDIAC CATHETERIZATION  04/27/2005   No sign. CAD,mod. MR,elevated LV end-diastolic pressure,EF 50% w/mild anterolateral hypokinesis  . CARDIOVASCULAR STRESS TEST  03/09/2005   Intermediate risk nuclear perfusion study w/ a primary fixed apical defect , represents either scar or marked apical thinning/  normal LV function and wall motion , ef 67%  . CARDIOVERSION  01/25/2011   successful  . CESAREAN SECTION  08/02/2007  .  CESAREAN SECTION WITH BILATERAL TUBAL LIGATION Bilateral 07/04/2013   Procedure: REPEAT CESAREAN SECTION WITH BILATERAL TUBAL LIGATION;  Surgeon: Joy HunArthur Stringer, MD;  Location: [facility];  Service: Obstetrics;  Laterality: Bilateral;  . CYSTOSCOPY/RETROGRADE/URETEROSCOPY/STONE EXTRACTION WITH BASKET Right 01/08/2016   Procedure: RIGHT URETEROSCOPY/LASER LITHOTRIPSY/STONE EXTRACTION WITH RETROGRADE PYELOGRAM AND RIGHT STENT PLACEMENT;  Surgeon: Joy FatBenjamin W Herrick, MD;  Location: [facility];  Service: Urology;  Laterality: Right;  . DILITATION & CURRETTAGE/HYSTROSCOPY WITH NOVASURE ABLATION N/A 05/30/2017   Procedure: DILATATION & CURETTAGE/HYSTEROSCOPY WITH NOVASURE ABLATION;  Surgeon: Joy HunStringer, Arthur, MD;  [facility];  Service: Gynecology;  Laterality: N/A;  . HOLMIUM LASER APPLICATION N/A 01/08/2016   Procedure: HOLMIUM LASER APPLICATION;  Surgeon: Joy FatBenjamin W Herrick, MD;  Location: [facility];  Service: Urology;  Laterality: N/A;  . TRANSTHORACIC ECHOCARDIOGRAM  03-29-2011  dr Joy Le   mild concentricl LVH,  ef 60-65%,  E/A fusion due to 1st degree AVB, normal wall motion/ mild to moderate MR, posteriorly directed jet/  mild TR/  normal RVSP (compared to prior echo 05/ 2011 mild improvment in the degree MR from moderate to more likely mild)  . WISDOM TOOTH EXTRACTION      Outpatient Medications Prior to Visit  Medication Sig Dispense Refill  . CINNAMON PO Take 2 capsules by mouth every morning.     . Cyanocobalamin (B-12) 1000 MCG SUBL Place under the tongue daily.    Joy Le. diltiazem (CARTIA XT) 120 MG 24 hr capsule Take 1 capsule (120 mg total) by mouth daily. 90 capsule 3  . ELIQUIS 5 MG TABS tablet TAKE 1 TABLET BY MOUTH TWICE DAILY(LABS NEEDED FOR FURTHER REFILLS) 180 tablet 1  . JANUVIA 100 MG tablet Take 1 tablet by mouth every morning.  3  . metFORMIN (GLUCOPHAGE) 500 MG tablet Take 1,000 mg by mouth 2 (two) times daily with a meal.    . metoprolol succinate (TOPROL-XL) 50 MG  24 hr tablet Take 1 tablet (50 mg total) by mouth daily. Take with or immediately following a meal. 90 tablet 3  . losartan (COZAAR) 50 MG tablet Take 1 tablet by mouth once daily 90 tablet 0  . meloxicam (MOBIC) 15 MG tablet Take 1 tablet by mouth every morning.     No facility-administered medications prior to visit.      Allergies:   Codeine   Social History   Socioeconomic History  . Marital status: Married    Spouse name: Lowella PettiesDavid Carvin "BJ"  . Number of children: 1  . Years of education: 8820  . Highest education level: Not on file  Occupational History  . Occupation: Magazine features editorTeacher    Employer: [employer]  Social Needs  . Financial resource strain: Not on file  . Food insecurity    Worry: Not on file    Inability: Not on file  . Transportation needs    Medical: Not on file    Non-medical: Not on file  Tobacco Use  . Smoking status: Never Smoker  . Smokeless  tobacco: Never Used  Substance and Sexual Activity  . Alcohol use: No  . Drug use: No  . Sexual activity: Yes    Partners: Male    Birth control/protection: Surgical    Comment: BTL  Lifestyle  . Physical activity    Days per week: Not on file    Minutes per session: Not on file  . Stress: Not on file  Relationships  . Social connections    Talks on Musiciane    Gets together: Not on file    Attends religious service: Not on file    Active member of club or organization: Not on file    Attends meetings of clubs or organizations: Not on file    Relationship status: Not on file  Other Topics Concern  . Not on file  Social History Narrative  . Not on file     Family History:  The patient's family history includes Alcohol abuse in her paternal grandfather; Alzheimer's disease in her maternal grandfather; Anemia in her sister; Diabetes in her maternal aunt, maternal grandmother, mother, and paternal aunt; Hypertension in her father, mother, and sister; Lung cancer in her paternal grandfather;  Stroke in her mother.   ROS:   Please see the history of present illness.    ROS All other systems are reviewed and are negative.   PHYSICAL EXAM:   VS:  BP 138/86   Pulse 78   Temp (!) 97.5 F (36.4 C)   Ht 5' 2.5" (1.588 m)   Wt 281 lb 3.2 oz (127.6 kg)   SpO2 98%   BMI 50.61 kg/m      General: Alert, oriented x3, no distress, super obese exam is benign last time I spoke with her she was leaving for the [facility]: no evidence of trauma, PERRL, EOMI, no exophtalmos or lid lag, no myxedema, no xanthelasma; normal ears, nose and oropharynx Neck: normal jugular venous pulsations and no hepatojugular reflux; brisk carotid pulses without delay and no carotid bruits Chest: clear to auscultation, no signs of consolidation by percussion or palpation, normal fremitus, symmetrical and full respiratory excursions Cardiovascular: normal position and quality of the apical impulse, regular rhythm, normal first and second heart sounds, no murmurs, rubs or gallops Abdomen: no tenderness or distention, no masses by palpation, no abnormal pulsatility or arterial bruits, normal bowel sounds, no hepatosplenomegaly Extremities: no clubbing, cyanosis or edema; 2+ radial, ulnar and brachial pulses bilaterally; 2+ right femoral, posterior tibial and dorsalis pedis pulses; 2+ left femoral, posterior tibial and dorsalis pedis pulses; no subclavian or femoral bruits Neurological: grossly nonfocal Psych: Normal mood and affect   Wt Readings from Last 3 Encounters:  09/27/19 281 lb 3.2 oz (127.6 kg)  09/13/18 276 lb 9.6 oz (125.5 kg)  04/30/18 278 lb (126.1 kg)    Studies/Labs Reviewed:   EKG:  EKG is ordered today.  It is very similar to last years and shows sinus rhythm first-degree AV block, left atrial abnormality, nonspecific intraventricular conduction delay with left axis deviation, QRS 122 ms.  April 17, 2019 hemoglobin A1c 6.9% Total cholesterol 179, triglycerides 143, HDL 56, LDL 102  Potassium 3.8, creatinine 0.50, normal liver function test TSH Hemoglobin 12.9, ferritin 19, transferrin saturation 24%  ASSESSMENT:    1. Paroxysmal atrial fibrillation (HCC)   2. On anticoagulant therapy   3. History of atrial septal defect   4. Super obese   5. Essential hypertension   6. Nephrolithiasis   7. Nonrheumatic mitral  valve regurgitation      PLAN:  In order of problems listed above:  1. AFib: Clinically asymptomatic.  No focal neurological events or bleeding problems.. CHADSVasc 2 (HTN, gender) and history of previous cardiac surgery.  2. Eliquis: No further problems with anemia since her endometrial ablation.  Iron stores are almost completely back to normal. 3. MR: I cannot even hear a murmur on physical exam today.  Mild to moderate mitral regurgitation on remote echo, only mild on 2019 echo. No evidence of left ventricular dilation.  4. ASD: No evidence of residual defect by echocardiogram.  Has long first-degree AV block and left axis deviation consistent with a previous ostium primum defect. 5. HTN: switched ACEi to ARB due to cough.  Well-controlled.  6. Obesity: Super obese.  At risk for multiple complications (she already has diabetes and hypertension).  Aggressive attempts at weight loss or strongly recommended. 7. Nephrolithiasis: No recent problems with hematuria  Medication Adjustments/Labs and Tests Ordered: Current medicines are reviewed at length with the patient today.  Concerns regarding medicines are outlined above.  Medication changes, Labs and Tests ordered today are listed in the Patient Instructions below. Patient Instructions  Medication Instructions:   Your physician recommends that you continue on your current medications as directed. Please refer to the Current Medication list given to you today.  *If you need a refill on your cardiac medications before your next appointment, please call your pharmacy*  Lab Work:  None ordered today   Testing/Procedures:  None ordered today  Follow-Up: At [facility], you and your health needs are our priority.  As part of our continuing mission to provide you with exceptional heart care, we have created designated Provider Care Teams.  These Care Teams include your primary Cardiologist (physician) and Advanced Practice Providers (APPs -  Physician Assistants and Nurse Practitioners) who all work together to provide you with the care you need, when you need it.  Your next appointment:   12 month(s)  The format for your next appointment:   In Person  Provider:   You may see Thurmon Fair, MD or one of the following Advanced Practice Providers on your designated Care Team:    Azalee Course, PA-C  Micah Flesher, [place] or   Judy Pimple, PA-C          Signed, Thurmon Fair, MD  09/27/2019 9:31 AM    [facility] [address] [place], [place], [place]  [address] Phone: [phone]; Fax: [phone]

## 2019-09-27 NOTE — Patient Instructions (Signed)
Medication Instructions:  Your physician recommends that you continue on your current medications as directed. Please refer to the Current Medication list given to you today.  *If you need a refill on your cardiac medications before your next appointment, please call your pharmacy*  Lab Work:  None ordered today  Testing/Procedures:  None ordered today  Follow-Up: At [facility], you and your health needs are our priority.  As part of our continuing mission to provide you with exceptional heart care, we have created designated Provider Care Teams.  These Care Teams include your primary Cardiologist (physician) and Advanced Practice Providers (APPs -  Physician Assistants and Nurse Practitioners) who all work together to provide you with the care you need, when you need it.  Your next appointment:   12 month(s)  The format for your next appointment:   In Person  Provider:   You may see Mihai Croitoru, MD or one of the following Advanced Practice Providers on your designated Care Team:    Hao Meng, PA-C  Angela Duke, PA-C or   Krista Kroeger, PA-C    

## 2019-10-13 ENCOUNTER — Other Ambulatory Visit: Payer: Self-pay | Admitting: Cardiovascular Disease

## 2019-12-02 ENCOUNTER — Other Ambulatory Visit: Payer: Self-pay | Admitting: Cardiovascular Disease

## 2020-01-13 ENCOUNTER — Other Ambulatory Visit: Payer: Self-pay | Admitting: Cardiovascular Disease

## 2020-01-14 ENCOUNTER — Other Ambulatory Visit: Payer: Self-pay

## 2020-04-15 ENCOUNTER — Other Ambulatory Visit: Payer: Self-pay | Admitting: Cardiovascular Disease

## 2020-04-16 NOTE — Telephone Encounter (Signed)
44 F 127.6 kg SCr 0.56 (10/2019), LOV 09/2019 Croitoru

## 2020-07-06 ENCOUNTER — Other Ambulatory Visit: Payer: Self-pay | Admitting: Cardiovascular Disease

## 2020-07-13 ENCOUNTER — Other Ambulatory Visit: Payer: Self-pay | Admitting: Cardiovascular Disease

## 2020-09-30 ENCOUNTER — Other Ambulatory Visit: Payer: Self-pay

## 2020-09-30 ENCOUNTER — Encounter: Payer: Self-pay | Admitting: Cardiovascular Disease

## 2020-09-30 ENCOUNTER — Ambulatory Visit: Payer: BC Managed Care – PPO | Admitting: Cardiovascular Disease

## 2020-09-30 VITALS — BP 140/78 | HR 64 | Ht 62.5 in | Wt 275.0 lb

## 2020-09-30 DIAGNOSIS — I480 Paroxysmal atrial fibrillation: Secondary | ICD-10-CM

## 2020-09-30 DIAGNOSIS — I340 Nonrheumatic mitral (valve) insufficiency: Secondary | ICD-10-CM | POA: Diagnosis not present

## 2020-09-30 DIAGNOSIS — E669 Obesity, unspecified: Secondary | ICD-10-CM

## 2020-09-30 DIAGNOSIS — N200 Calculus of kidney: Secondary | ICD-10-CM

## 2020-09-30 DIAGNOSIS — Z8774 Personal history of (corrected) congenital malformations of heart and circulatory system: Secondary | ICD-10-CM

## 2020-09-30 DIAGNOSIS — Z7901 Long term (current) use of anticoagulants: Secondary | ICD-10-CM

## 2020-09-30 DIAGNOSIS — E1169 Type 2 diabetes mellitus with other specified complication: Secondary | ICD-10-CM

## 2020-09-30 DIAGNOSIS — I10 Essential (primary) hypertension: Secondary | ICD-10-CM

## 2020-09-30 NOTE — Patient Instructions (Signed)
Medication Instructions:  No changes *If you need a refill on your cardiac medications before your next appointment, please call your pharmacy*   Lab Work: None ordered If you have labs (blood work) drawn today and your tests are completely normal, you will receive your results only by: Marland Kitchen MyChart Message (if you have MyChart) OR . A paper copy in the mail If you have any lab test that is abnormal or we need to change your treatment, we will call you to review the results.   Testing/Procedures: Your physician has requested that you have an echocardiogram in July 2022. Echocardiography is a painless test that uses sound waves to create images of your heart. It provides your doctor with information about the size and shape of your heart and how well your heart's chambers and valves are working. You may receive an ultrasound enhancing agent through an IV if needed to better visualize your heart during the echo.This procedure takes approximately one hour. There are no restrictions for this procedure. This will take place at the [address].     Follow-Up: At [facility], you and your health needs are our priority.  As part of our continuing mission to provide you with exceptional heart care, we have created designated Provider Care Teams.  These Care Teams include your primary Cardiologist (physician) and Advanced Practice Providers (APPs -  Physician Assistants and Nurse Practitioners) who all work together to provide you with the care you need, when you need it.  We recommend signing up for the patient portal called "MyChart".  Sign up information is provided on this After Visit Summary.  MyChart is used to connect with patients for Virtual Visits (Telemedicine).  Patients are able to view lab/test results, encounter notes, upcoming appointments, etc.  Non-urgent messages can be sent to your provider as well.   To learn more about what you can do with MyChart, go to  [email or web address].    Your next appointment:   12 month(s)  The format for your next appointment:   In Person  Provider:   You may see Thurmon Fair, MD or one of the following Advanced Practice Providers on your designated Care Team:    Azalee Course, PA-C  Micah Flesher, PA-C or   Judy Pimple, [place]

## 2020-09-30 NOTE — Progress Notes (Signed)
Patient ID: Joy Le, female   DOB: 1975/11/14, 44 y.o.   MRN: [id number]     Cardiology Office Note    Date:  10/02/2020   ID:  Joy Le, DOB 1976/08/26, MRN [id number]  PCP:  Laqueta Due., MD  Cardiologist:   Thurmon Fair, MD   Chief Complaint  Patient presents with  . Atrial Fibrillation    History of congenital heart disease repair    History of Present Illness:  Joy Le is a 44 y.o. female with a history of ostium primum atrial septal defect and congenital mitral valve insufficiency repaired surgically at age 44 who has had problems with intermittent paroxysmal atrial fibrillation.   The patient specifically denies any chest pain at rest exertion, dyspnea at rest or with exertion, orthopnea, paroxysmal nocturnal dyspnea, syncope, palpitations, focal neurological deficits, intermittent claudication, lower extremity edema, unexplained weight gain, cough, hemoptysis or wheezing.  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  She has a history of severe recurrent iron deficiency anemia that improved after endometrial ablation.  She remains morbidly obese, although she has made some progress with weight loss.  History of cough with ACE inhibitor's, but tolerates losartan well.  I do not have recent hemoglobin A1c or lipid profile available, but she is planning to have those checked next month with her primary care provider.   Past Medical History:  Diagnosis Date  . Anticoagulant long-term use    Eliquis  . Complication of anesthesia    slow to wake  . First degree heart block   . Heart murmur    congenital --  per dr Thersia Petraglia (cardiologist) note murmur not easily audible on exam and did not have evidence of left ventricular dilation  . History of gestational diabetes 2008  . History of kidney stones   . History of repair of congenital atrial septal defect (ASD) 80  age 22---  cardiologist- dr Sajan Cheatwood   repair ostium primum atrial septal  defect (no residual effects per last echo, long AV conduction time consistant w/ previous ostium primum) associated cleft anterior mitral leaflet , repaired   . Iron deficiency anemia   . Menorrhagia with irregular cycle   . Mitral regurgitation    pt is s/p repair cleft anterior Mitral leaflet / per last echo 03-29-2011  mild to moderate MR w/ improvement since last echo 05/ 2011, now mostly mild  . Nephrolithiasis urologist-  dr Marlou Porch   left side non-obstructive per Renal US 04/ 2017  . PAF (paroxysmal atrial fibrillation) [facility]) cardiologist-  dr Keani Gotcher   DCCV 02-04-2011 successful (does note have valvular atrial fib.)  . Systemic hypertension   . Type 2 diabetes mellitus (HCC)     Past Surgical History:  Procedure Laterality Date  . ASD REPAIR, OSTIUM PRIMUM  1984  age 45    [facility]   and repaired cleft anterior mitral valve leaflet defect (no prosthesis)  . CARDIAC CATHETERIZATION  04/27/2005   No sign. CAD,mod. MR,elevated LV end-diastolic pressure,EF 50% w/mild anterolateral hypokinesis  . CARDIOVASCULAR STRESS TEST  03/09/2005   Intermediate risk nuclear perfusion study w/ a primary fixed apical defect , represents either scar or marked apical thinning/  normal LV function and wall motion , ef 67%  . CARDIOVERSION  01/25/2011   successful  . CESAREAN SECTION  08/02/2007  . CESAREAN SECTION WITH BILATERAL TUBAL LIGATION Bilateral 07/04/2013   Procedure: REPEAT CESAREAN SECTION WITH BILATERAL TUBAL LIGATION;  Surgeon: Kirkland Hun, MD;  Location: [facility];  Service: Obstetrics;  Laterality: Bilateral;  . CYSTOSCOPY/RETROGRADE/URETEROSCOPY/STONE EXTRACTION WITH BASKET Right 01/08/2016   Procedure: RIGHT URETEROSCOPY/LASER LITHOTRIPSY/STONE EXTRACTION WITH RETROGRADE PYELOGRAM AND RIGHT STENT PLACEMENT;  Surgeon: Crist FatBenjamin W Herrick, MD;  Location: [facility];  Service: Urology;  Laterality: Right;  . DILITATION & CURRETTAGE/HYSTROSCOPY WITH NOVASURE ABLATION N/A 05/30/2017   Procedure:  DILATATION & CURETTAGE/HYSTEROSCOPY WITH NOVASURE ABLATION;  Surgeon: Kirkland HunStringer, Arthur, MD;  [facility];  Service: Gynecology;  Laterality: N/A;  . HOLMIUM LASER APPLICATION N/A 01/08/2016   Procedure: HOLMIUM LASER APPLICATION;  Surgeon: Crist FatBenjamin W Herrick, MD;  Location: [facility];  Service: Urology;  Laterality: N/A;  . TRANSTHORACIC ECHOCARDIOGRAM  03-29-2011  dr Margerite Impastato   mild concentricl LVH,  ef 60-65%,  E/A fusion due to 1st degree AVB, normal wall motion/ mild to moderate MR, posteriorly directed jet/  mild TR/  normal RVSP (compared to prior echo 05/ 2011 mild improvment in the degree MR from moderate to more likely mild)  . WISDOM TOOTH EXTRACTION      Outpatient Medications Prior to Visit  Medication Sig Dispense Refill  . CINNAMON PO Take 2 capsules by mouth every morning.     . Cyanocobalamin (B-12) 1000 MCG SUBL Place under the tongue daily.    Marland Kitchen. diltiazem (CARDIZEM CD) 120 MG 24 hr capsule Take 1 capsule by mouth once daily 90 capsule 0  . ELIQUIS 5 MG TABS tablet TAKE 1 TABLET BY MOUTH TWICE DAILY . APPOINTMENT REQUIRED FOR FUTURE REFILLS 180 tablet 1  . JANUVIA 100 MG tablet Take 1 tablet by mouth every morning.  3  . losartan (COZAAR) 50 MG tablet Take 1 tablet (50 mg total) by mouth daily. 90 tablet 3  . metFORMIN (GLUCOPHAGE) 500 MG tablet Take 1,000 mg by mouth 2 (two) times daily with a meal.    . metoprolol succinate (TOPROL-XL) 50 MG 24 hr tablet TAKE 1 TABLET BY MOUTH ONCE DAILY WITH  OR  IMMEDIATELY  FOLLOWING  A  MEAL 90 tablet 1   No facility-administered medications prior to visit.     Allergies:   Codeine   Social History   Socioeconomic History  . Marital status: Married    Spouse name: Lowella PettiesDavid Nantz "BJ"  . Number of children: 1  . Years of education: 7120  . Highest education level: Not on file  Occupational History  . Occupation: Magazine features editorTeacher    Employer: [employer]  Tobacco Use  . Smoking status: Never Smoker  .  Smokeless tobacco: Never Used  Substance and Sexual Activity  . Alcohol use: No  . Drug use: No  . Sexual activity: Yes    Partners: Male    Birth control/protection: Surgical    Comment: BTL  Other Topics Concern  . Not on file  Social History Narrative  . Not on file   Social Determinants of Health   Financial Resource Strain:   . Difficulty of Paying Living Expenses: Not on file  Food Insecurity:   . Worried About Programme researcher, broadcasting/film/videounning Out of Food in the Last Year: Not on file  . Ran Out of Food in the Last Year: Not on file  Transportation Needs:   . Lack of Transportation (Medical): Not on file  . Lack of Transportation (Non-Medical): Not on file  Physical Activity:   . Days of Exercise per Week: Not on file  . Minutes of Exercise per Session: Not on file  Stress:   . Feeling of Stress : Not  on file  Social Connections:   . Frequency of Communication with Friends and Family: Not on file  . Frequency of Social Gatherings with Friends and Family: Not on file  . Attends Religious Services: Not on file  . Active Member of Clubs or Organizations: Not on file  . Attends Banker Meetings: Not on file  . Marital Status: Not on file     Family History:  The patient's family history includes Alcohol abuse in her paternal grandfather; Alzheimer's disease in her maternal grandfather; Anemia in her sister; Diabetes in her maternal aunt, maternal grandmother, mother, and paternal aunt; Hypertension in her father, mother, and sister; Lung cancer in her paternal grandfather; Stroke in her mother.   ROS:   Please see the history of present illness.    All other systems are reviewed and are negative.   PHYSICAL EXAM:   VS:  BP 140/78   Pulse 64   Ht 5' 2.5" (1.588 m)   Wt 275 lb (124.7 kg)   SpO2 99%   BMI 49.50 kg/m      General: Alert, oriented x3, no distress, morbidly obese Head: no evidence of trauma, PERRL, EOMI, no exophtalmos or lid lag, no myxedema, no xanthelasma;  normal ears, nose and oropharynx Neck: normal jugular venous pulsations and no hepatojugular reflux; brisk carotid pulses without delay and no carotid bruits Chest: clear to auscultation, no signs of consolidation by percussion or palpation, normal fremitus, symmetrical and full respiratory excursions Cardiovascular: normal position and quality of the apical impulse, regular rhythm, normal first and second heart sounds, no murmurs, rubs or gallops Abdomen: no tenderness or distention, no masses by palpation, no abnormal pulsatility or arterial bruits, normal bowel sounds, no hepatosplenomegaly Extremities: no clubbing, cyanosis or edema; 2+ radial, ulnar and brachial pulses bilaterally; 2+ right femoral, posterior tibial and dorsalis pedis pulses; 2+ left femoral, posterior tibial and dorsalis pedis pulses; no subclavian or femoral bruits Neurological: grossly nonfocal Psych: Normal mood and affect    Wt Readings from Last 3 Encounters:  09/30/20 275 lb (124.7 kg)  09/27/19 281 lb 3.2 oz (127.6 kg)  09/13/18 276 lb 9.6 oz (125.5 kg)    Studies/Labs Reviewed:   EKG:  EKG is ordered today.  Unchanged from last year, shows normal sinus rhythm, very long first-degree AV block, left atrial abnormality, left anterior fascicular block, normal QTC 406 ms  April 17, 2019 hemoglobin A1c 6.9% Total cholesterol 179, triglycerides 143, HDL 56, LDL 102 Potassium 3.8, creatinine 0.50, normal liver function test TSH Hemoglobin 12.9, ferritin 19, transferrin saturation 24%  10/18/2019  hemoglobin A1c 7.2% total cholesterol 179, triglycerides 123, HDL 56, direct LDL 102 TSH 0.78, creatinine 0.56, hemoglobin 12.8, normal MCV  ASSESSMENT:    1. PAF (paroxysmal atrial fibrillation) (HCC)   2. Nonrheumatic mitral valve regurgitation   3. Long term current use of anticoagulant   4. History of repair of congenital atrial septal defect (ASD)   5. Essential hypertension   6. Diabetes mellitus type 2 in  obese (HCC)   7. Morbid obesity (HCC)   8. Nephrolithiasis      PLAN:  In order of problems listed above:  1. AFib: No symptomatic recurrence.  No focal neurological events and no bleeding problems.  CHADSVasc 2 (HTN, gender) and history of previous cardiac surgery.  2. Eliquis: Well-tolerated, no anemia since her endometrial ablation. 3. MR: Mild mitral insufficiency on most recent echo, no murmur heard on physical exam today.  Consider repeating her  echo at roughly 3 to 5-year intervals. 4. ASD: No evidence of residual defect by echocardiogram.  Has long first-degree AV block and left axis deviation consistent with a previous ostium primum defect. 5. HTN: Fair, albeit not perfect control.  Asked her to check her blood pressure periodically at home or at school.  Call me if her systolic blood pressure is consistently higher than 130.  Try to lose weight, continue exercise, restrict sodium in diet. 6. DM: Glucose control has deteriorated over the last few months of 2020.  Time to reevaluate it consider GLP-1 agonist such as semaglutide to assist with weight loss. 7. Obesity: Morbidly obese.  Has diabetes and hypertension.  Encourage further attempts at weight loss. 8. Nephrolithiasis: Has not had renal colic or hematuria over the last 12 months.  Medication Adjustments/Labs and Tests Ordered: Current medicines are reviewed at length with the patient today.  Concerns regarding medicines are outlined above.  Medication changes, Labs and Tests ordered today are listed in the Patient Instructions below. Patient Instructions  Medication Instructions:  No changes *If you need a refill on your cardiac medications before your next appointment, please call your pharmacy*   Lab Work: None ordered If you have labs (blood work) drawn today and your tests are completely normal, you will receive your results only by: Marland Kitchen MyChart Message (if you have MyChart) OR . A paper copy in the mail If you have  any lab test that is abnormal or we need to change your treatment, we will call you to review the results.   Testing/Procedures: Your physician has requested that you have an echocardiogram in July 2022. Echocardiography is a painless test that uses sound waves to create images of your heart. It provides your doctor with information about the size and shape of your heart and how well your heart's chambers and valves are working. You may receive an ultrasound enhancing agent through an IV if needed to better visualize your heart during the echo.This procedure takes approximately one hour. There are no restrictions for this procedure. This will take place at the [address], [address].     Follow-Up: At [facility], you and your health needs are our priority.  As part of our continuing mission to provide you with exceptional heart care, we have created designated Provider Care Teams.  These Care Teams include your primary Cardiologist (physician) and Advanced Practice Providers (APPs -  Physician Assistants and Nurse Practitioners) who all work together to provide you with the care you need, when you need it.  We recommend signing up for the patient portal called "MyChart".  Sign up information is provided on this After Visit Summary.  MyChart is used to connect with patients for Virtual Visits (Telemedicine).  Patients are able to view lab/test results, encounter notes, upcoming appointments, etc.  Non-urgent messages can be sent to your provider as well.   To learn more about what you can do with MyChart, go to [email or web address].    Your next appointment:   12 month(s)  The format for your next appointment:   In Person  Provider:   You may see Thurmon Fair, MD or one of the following Advanced Practice Providers on your designated Care Team:    Azalee Course, PA-C  Micah Flesher, [place] or   Judy Pimple, PA-C        Signed, Thurmon Fair, MD  10/02/2020 2:00 PM    [facility] [address] [place],  [place], [place]  [address] Phone: [phone]; Fax: [phone]

## 2020-10-02 ENCOUNTER — Encounter: Payer: Self-pay | Admitting: Cardiovascular Disease

## 2020-10-12 ENCOUNTER — Other Ambulatory Visit: Payer: Self-pay | Admitting: Cardiovascular Disease

## 2020-10-19 ENCOUNTER — Other Ambulatory Visit: Payer: Self-pay | Admitting: Cardiovascular Disease

## 2020-10-19 NOTE — Telephone Encounter (Signed)
Prescription refill request for Eliquis received. Indication: Atrial Fibrillation Last office visit: 09/2020  Croitoru Scr: needs labs Age: 44 Weight:124.7 kg  Prescription refilled

## 2021-01-04 ENCOUNTER — Other Ambulatory Visit: Payer: Self-pay | Admitting: Cardiovascular Disease

## 2021-01-21 ENCOUNTER — Other Ambulatory Visit: Payer: Self-pay | Admitting: Cardiovascular Disease

## 2021-01-22 NOTE — Telephone Encounter (Signed)
Prescription refill request for Eliquis received. Indication: atrial fibrillation Last office visit: 11/21 croitoru Scr: needs labs Age: 45 Weight:124.7 kg  Prescription refilled

## 2021-02-15 ENCOUNTER — Other Ambulatory Visit: Payer: Self-pay | Admitting: Cardiovascular Disease

## 2021-02-15 NOTE — Telephone Encounter (Signed)
Prescription refill request for Eliquis received. Indication:atrial fib Last office visit:11/21 croitoru Scr:0.64 12/21 Age: 45 Weight:124.7 kg  Prescription refilled

## 2021-06-04 ENCOUNTER — Other Ambulatory Visit (HOSPITAL_COMMUNITY): Payer: BC Managed Care – PPO

## 2021-06-04 ENCOUNTER — Encounter (HOSPITAL_COMMUNITY): Payer: Self-pay | Admitting: Cardiology

## 2021-06-04 NOTE — Progress Notes (Unsigned)
Patient ID: Joy Le, female   DOB: 1976-01-03, 45 y.o.   MRN: [id number]  Verified appointment "no show" status with Aaliyah at 11:28am.

## 2021-06-18 ENCOUNTER — Other Ambulatory Visit: Payer: Self-pay

## 2021-06-18 ENCOUNTER — Ambulatory Visit (HOSPITAL_COMMUNITY): Payer: BC Managed Care – PPO | Attending: Cardiovascular Disease

## 2021-06-18 DIAGNOSIS — I340 Nonrheumatic mitral (valve) insufficiency: Secondary | ICD-10-CM | POA: Diagnosis not present

## 2021-06-18 LAB — ECHOCARDIOGRAM COMPLETE
AR max vel: 1.26 cm2
AV Area VTI: 1.26 cm2
AV Area mean vel: 1.39 cm2
AV Mean grad: 10 mmHg
AV Peak grad: 21.4 mmHg
Ao pk vel: 2.32 m/s
Area-P 1/2: 1.67 cm2
S' Lateral: 2.5 cm

## 2021-08-23 ENCOUNTER — Other Ambulatory Visit: Payer: Self-pay | Admitting: Cardiovascular Disease

## 2021-08-23 NOTE — Telephone Encounter (Signed)
Prescription refill request for Eliquis received. Indication:Afib Last office visit:11/21 Scr:0.6 Age: 45 Weight:124.7 kg  Prescription refilled

## 2021-09-27 ENCOUNTER — Other Ambulatory Visit: Payer: Self-pay | Admitting: Cardiovascular Disease

## 2021-10-11 ENCOUNTER — Other Ambulatory Visit: Payer: Self-pay | Admitting: Cardiovascular Disease

## 2021-10-18 ENCOUNTER — Other Ambulatory Visit: Payer: Self-pay | Admitting: Cardiovascular Disease

## 2021-11-08 ENCOUNTER — Other Ambulatory Visit: Payer: Self-pay | Admitting: Cardiovascular Disease

## 2021-12-06 ENCOUNTER — Other Ambulatory Visit: Payer: Self-pay | Admitting: Cardiovascular Disease

## 2021-12-10 ENCOUNTER — Telehealth: Payer: Self-pay | Admitting: Cardiovascular Disease

## 2021-12-10 NOTE — Telephone Encounter (Signed)
Spoke to patient appointment scheduled with Dr.Croitoru 2/9 at 11:30 am.

## 2021-12-10 NOTE — Telephone Encounter (Signed)
° °  Patient Name: Joy Le  DOB: 03-09-1976 MRN: [id number]  Primary Cardiologist: Thurmon Fair, MD  Chart reviewed as part of pre-operative protocol coverage.   IF SIMPLE EXTRACTION/CLEANINGS: Simple dental extractions are considered low risk procedures per guidelines and generally do not require any specific cardiac clearance. It is also generally accepted that for simple extractions and dental cleanings, there is no need to interrupt blood thinner therapy.  IF MULTIPLE EXTRACTIONS OR COMPLEX DENTAL PROCEDURES: (follow standard pre-op clearance process). The patient was advised that if she develops new symptoms prior to surgery to contact our office to arrange for a follow-up visit, and she verbalized understanding.  SBE prophylaxis is/is not required for the patient from a cardiac standpoint.  I will route this recommendation to the requesting party via Epic fax function and remove from pre-op pool.  Please call with questions.  Sosie Gato David Stall, PA-C 12/10/2021, 1:05 PM

## 2021-12-10 NOTE — Telephone Encounter (Deleted)
° °  Name: Joy Le  DOB: 1976-05-09  MRN: [id number]  Primary Cardiologist: Thurmon Fair, MD  Chart reviewed as part of pre-operative protocol coverage. Because of Joy Le's past medical history and time since last visit, she will require a follow-up visit in order to better assess preoperative cardiovascular risk.  Pre-op covering staff: - Please schedule appointment and call patient to inform them. If patient already had an upcoming appointment within acceptable timeframe, please add "pre-op clearance" to the appointment notes so provider is aware. - Please contact requesting surgeon's office via preferred method (i.e, phone, fax) to inform them of need for appointment prior to surgery.  If applicable, this message will also be routed to pharmacy pool and/or primary cardiologist for input on holding anticoagulant/antiplatelet agent as requested below so that this information is available to the clearing provider at time of patient's appointment.   Erika Hussar David Stall, PA-C  12/10/2021, 1:11 PM

## 2021-12-10 NOTE — Telephone Encounter (Signed)
° ° ° °  Pre-operative Risk Assessment    Patient Name: Joy Le  DOB: 1976/01/03 MRN: [id number]     Request for Surgical Clearance    Procedure:  Dental Extraction - Amount of Teeth to be Pulled:  1 tooth  Date of Surgery:  Clearance TBD                                 Surgeon: Dr. Memory Dance Surgeon's Group or Practice Name:  [facility] Phone number:  [phone] Fax number:  [phone]   Type of Clearance Requested:   - Medical  - Pharmacy:  Hold Apixaban (Eliquis) 3 days   Type of Anesthesia:   oral block and lidocaine    Additional requests/questions:    Signed, Angeline Bufford Lope   12/10/2021, 11:01 AM

## 2021-12-13 ENCOUNTER — Other Ambulatory Visit: Payer: Self-pay | Admitting: Cardiovascular Disease

## 2021-12-16 ENCOUNTER — Encounter: Payer: Self-pay | Admitting: Cardiovascular Disease

## 2021-12-16 ENCOUNTER — Ambulatory Visit: Payer: BC Managed Care – PPO | Admitting: Cardiovascular Disease

## 2021-12-16 ENCOUNTER — Other Ambulatory Visit: Payer: Self-pay

## 2021-12-16 VITALS — BP 128/72 | HR 68 | Ht 62.5 in | Wt 266.4 lb

## 2021-12-16 DIAGNOSIS — I480 Paroxysmal atrial fibrillation: Secondary | ICD-10-CM

## 2021-12-16 DIAGNOSIS — D6869 Other thrombophilia: Secondary | ICD-10-CM

## 2021-12-16 DIAGNOSIS — E669 Obesity, unspecified: Secondary | ICD-10-CM

## 2021-12-16 DIAGNOSIS — I10 Essential (primary) hypertension: Secondary | ICD-10-CM

## 2021-12-16 DIAGNOSIS — E1169 Type 2 diabetes mellitus with other specified complication: Secondary | ICD-10-CM

## 2021-12-16 DIAGNOSIS — I340 Nonrheumatic mitral (valve) insufficiency: Secondary | ICD-10-CM

## 2021-12-16 DIAGNOSIS — Z8774 Personal history of (corrected) congenital malformations of heart and circulatory system: Secondary | ICD-10-CM | POA: Diagnosis not present

## 2021-12-16 DIAGNOSIS — N200 Calculus of kidney: Secondary | ICD-10-CM

## 2021-12-16 NOTE — Patient Instructions (Signed)

## 2021-12-16 NOTE — Progress Notes (Signed)
Patient ID: Joy Le, female   DOB: 1976-01-26, 46 y.o.   MRN: [id number]     Cardiology Office Note    Date:  12/16/2021   ID:  Joy Le, DOB [birth year], MRN [id number]  PCP:  Karleen Hampshire., MD  Cardiologist:   Sanda Klein, MD   Chief Complaint  Patient presents with   Atrial Fibrillation    History of Present Illness:  Joy Le is a 46 y.o. female with a history of ostium primum atrial septal defect and congenital mitral valve insufficiency repaired surgically at age 71 who has had problems with intermittent paroxysmal atrial fibrillation.   The patient specifically denies any chest pain at rest exertion, dyspnea at rest or with exertion, orthopnea, paroxysmal nocturnal dyspnea, syncope, palpitations, focal neurological deficits, intermittent claudication, lower extremity edema, unexplained weight gain, cough, hemoptysis or wheezing.  Further hematuria or problems with kidney stones and has not had menorrhagia since her endometrial ablation.  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  Echocardiogram in August 2022 showed moderate LVH with normal left ventricular systolic function and only mild mitral insufficiency.  She remains morbidly obese, although she has made some progress with weight loss.  History of cough with ACE inhibitors, but tolerates losartan well.    Past Medical History:  Diagnosis Date   Anticoagulant long-term use    Eliquis   Complication of anesthesia    slow to wake   First degree heart block    Heart murmur    congenital --  per dr Miarose Lippert (cardiologist) note murmur not easily audible on exam and did not have evidence of left ventricular dilation   History of gestational diabetes 2008   History of kidney stones    History of repair of congenital atrial septal defect (ASD) 85  age 31---  cardiologist- dr Basilia Stuckert   repair ostium primum atrial septal defect (no residual  effects per last echo, long AV conduction time consistant w/ previous ostium primum) associated cleft anterior mitral leaflet , repaired    Iron deficiency anemia    Menorrhagia with irregular cycle    Mitral regurgitation    pt is s/p repair cleft anterior Mitral leaflet / per last echo 03-29-2011  mild to moderate MR w/ improvement since last echo 05/ 2011, now mostly mild   Nephrolithiasis urologist-  dr Louis Meckel   left side non-obstructive per Renal US 04/ 2017   PAF (paroxysmal atrial fibrillation) [facility]) cardiologist-  dr Ruperto Kiernan   DCCV 02-04-2011 successful (does note have valvular atrial fib.)   Systemic hypertension    Type 2 diabetes mellitus ([place])     Past Surgical History:  Procedure Laterality Date   ASD REPAIR, OSTIUM PRIMUM  1984  age 81    [facility]   and repaired cleft anterior mitral valve leaflet defect (no prosthesis)   CARDIAC CATHETERIZATION  04/27/2005   No sign. CAD,mod. MR,elevated LV end-diastolic pressure,EF [id number] w/mild anterolateral hypokinesis   CARDIOVASCULAR STRESS TEST  03/09/2005   Intermediate risk nuclear perfusion study w/ a primary fixed apical defect , represents either scar or marked apical thinning/  normal LV function and wall motion , ef 67%   CARDIOVERSION  01/25/2011   successful   CESAREAN SECTION  08/02/2007   CESAREAN SECTION WITH BILATERAL TUBAL LIGATION Bilateral 07/04/2013   Procedure: REPEAT CESAREAN SECTION WITH BILATERAL TUBAL LIGATION;  Surgeon: Ena Dawley, MD;  Location: [facility];  Service: Obstetrics;  Laterality:  Bilateral;   CYSTOSCOPY/RETROGRADE/URETEROSCOPY/STONE EXTRACTION WITH BASKET Right 01/08/2016   Procedure: RIGHT URETEROSCOPY/LASER LITHOTRIPSY/STONE EXTRACTION WITH RETROGRADE PYELOGRAM AND RIGHT STENT PLACEMENT;  Surgeon: Ardis Hughs, MD;  Location: [facility];  Service: Urology;  Laterality: Right;   DILITATION & CURRETTAGE/HYSTROSCOPY WITH NOVASURE ABLATION N/A 05/30/2017   Procedure: DILATATION & CURETTAGE/HYSTEROSCOPY WITH  NOVASURE ABLATION;  Surgeon: Ena Dawley, MD;  Location: [facility];  Service: Gynecology;  Laterality: N/A;   HOLMIUM LASER APPLICATION N/A [id number]   Procedure: HOLMIUM LASER APPLICATION;  Surgeon: Ardis Hughs, MD;  Location: [facility];  Service: Urology;  Laterality: N/A;   TRANSTHORACIC ECHOCARDIOGRAM  03-29-2011  dr Cheralyn Oliver   mild concentricl LVH,  ef 60-65%,  E/A fusion due to 1st degree AVB, normal wall motion/ mild to moderate MR, posteriorly directed jet/  mild TR/  normal RVSP (compared to prior echo 05/ 2011 mild improvment in the degree MR from moderate to more likely mild)   WISDOM TOOTH EXTRACTION      Outpatient Medications Prior to Visit  Medication Sig Dispense Refill   apixaban (ELIQUIS) 5 MG TABS tablet Take 1 tablet (5 mg total) by mouth 2 (two) times daily. 60 tablet 5   Blood Glucose Monitoring Suppl (GLUCOCOM BLOOD GLUCOSE MONITOR) DEVI Use as directed to check blood sugars once daily.     cetirizine (ZYRTEC) 10 MG tablet Take 10 mg by mouth daily.     CHOLECALCIFEROL PO Take 1 tablet by mouth daily in the afternoon.     CINNAMON PO Take 2 capsules by mouth every morning.      Cyanocobalamin (B-12) 1000 MCG SUBL Place under the tongue daily.     diltiazem (CARDIZEM CD) 120 MG 24 hr capsule TAKE 1 CAPSULE BY MOUTH ONCE DAILY . APPOINTMENT REQUIRED FOR FUTURE REFILLS 30 capsule 0   fluticasone (FLONASE) 50 MCG/ACT nasal spray Place 1 spray into both nostrils daily.     JANUVIA 100 MG tablet Take 1 tablet by mouth every morning.  3   losartan (COZAAR) 50 MG tablet Take 1 tablet by mouth once daily 90 tablet 0   metFORMIN (GLUCOPHAGE) 500 MG tablet Take 1,000 mg by mouth 2 (two) times daily with a meal.     metoprolol succinate (TOPROL-XL) 50 MG 24 hr tablet TAKE 1 TABLET BY MOUTH ONCE DAILY WITH  OR  IMMEDIATELY  FOLLOWING  A  MEAL 90 tablet 0   amoxicillin (AMOXIL) 500 MG capsule TAKE FOUR CAPSULES BY MOUTH ONE HOUR BEFORE APPOINTMENT (Patient not  taking: Reported on 12/16/2021)     fluconazole (DIFLUCAN) 150 MG tablet Take 150 mg by mouth once. (Patient not taking: Reported on 12/16/2021)     penicillin v potassium (VEETID) 500 MG tablet Take 500 mg by mouth every 6 (six) hours.     No facility-administered medications prior to visit.     Allergies:   Codeine   Social History   Socioeconomic History   Marital status: Married    Spouse name: Joy Henneman "BJ"   Number of children: 1   Years of education: 20   Highest education level: Not on file  Occupational History   Occupation: Product manager: [employer]  Tobacco Use   Smoking status: Never   Smokeless tobacco: Never  Substance and Sexual Activity   Alcohol use: No   Drug use: No   Sexual activity: Yes    Partners: Male    Birth control/protection: Surgical    Comment: BTL  Other Topics Concern   Not on file  Social History Narrative   Not on file   Social Determinants of Health   Financial Resource Strain: Not on file  Food Insecurity: Not on file  Transportation Needs: Not on file  Physical Activity: Not on file  Stress: Not on file  Social Connections: Not on file     Family History:  The patient's family history includes Alcohol abuse in her paternal grandfather; Alzheimer's disease in her maternal grandfather; Anemia in her sister; Diabetes in her maternal aunt, maternal grandmother, mother, and paternal aunt; Hypertension in her father, mother, and sister; Lung cancer in her paternal grandfather; Stroke in her mother.   ROS:   Please see the history of present illness.    All other systems are reviewed and are negative.   PHYSICAL EXAM:   VS:  BP 128/72 (BP Location: Left Arm, Patient Position: Sitting, Cuff Size: Large)    Pulse 68    Ht 5' 2.5" (1.588 m)    Wt 266 lb 6.4 oz (120.8 kg)    SpO2 100%    BMI 47.95 kg/m       General: Alert, oriented x3, no distress, morbidly obese Head: no evidence of trauma, PERRL, EOMI, no  exophtalmos or lid lag, no myxedema, no xanthelasma; normal ears, nose and oropharynx Neck: normal jugular venous pulsations and no hepatojugular reflux; brisk carotid pulses without delay and no carotid bruits Chest: clear to auscultation, no signs of consolidation by percussion or palpation, normal fremitus, symmetrical and full respiratory excursions Cardiovascular: normal position and quality of the apical impulse, regular rhythm, normal first and second heart sounds, no murmurs, rubs or gallops Abdomen: no tenderness or distention, no masses by palpation, no abnormal pulsatility or arterial bruits, normal bowel sounds, no hepatosplenomegaly Extremities: no clubbing, cyanosis or edema; 2+ radial, ulnar and brachial pulses bilaterally; 2+ right femoral, posterior tibial and dorsalis pedis pulses; 2+ left femoral, posterior tibial and dorsalis pedis pulses; no subclavian or femoral bruits Neurological: grossly nonfocal Psych: Normal mood and affect     Wt Readings from Last 3 Encounters:  12/16/21 266 lb 6.4 oz (120.8 kg)  09/30/20 275 lb (124.7 kg)  09/27/19 281 lb 3.2 oz (127.6 kg)    Studies/Labs Reviewed:   EKG:  EKG is ordered today.  Personally reviewed, looks very similar to last year, shows normal sinus rhythm with long first-degree AV block (PR interval 208 ms), left atrial abnormality, left anterior fascicular block and a broad QRS at 120 ms.  QTc 421 ms    Labs 06/09/2021 Glucose 115, creatinine 0.61, potassium 4.3, normal liver function tests globin 13.8 Cholesterol 165, triglycerides 104, HDL 55, LDL direct 91  ASSESSMENT:    1. PAF (paroxysmal atrial fibrillation) ([place])   2. Acquired thrombophilia ([place])   3. Nonrheumatic mitral valve regurgitation   4. History of repair of congenital atrial septal defect (ASD)   5. Essential hypertension   6. Diabetes mellitus type 2 in obese ([place])   7. Morbid obesity ([place])   8. Nephrolithiasis       PLAN:  In order of  problems listed above:  AFib: No clinically evident episodes.  Denies bleeding problems or focal neurological events.  CHADSVasc 2 (HTN, gender) and history of previous mitral valve surgery.  Eliquis: Well-tolerated, has not had problems with anemia or overt bleeding since she underwent endometrial ablation. MR: Mild mitral insufficiency.  Repeat echocardiogram every 5 years if she develops new symptoms. ASD: Residual defect  by echo.  Has long first-degree AV block and left axis deviation consistent with a previous ostium primum defect. HTN: Well-controlled DM: Fasting glucose was 115 in August.  Improved from 142 a couple of years ago.  Do not have a hemoglobin A1c. Obesity: Remains morbidly obese, complicated by diabetes mellitus and hypertension, has lost about 15 pounds in last couple of years, encouraged to continue her successful weight loss. Nephrolithiasis: Has not had renal colic or hematuria over the last 12 months.  Medication Adjustments/Labs and Tests Ordered: Current medicines are reviewed at length with the patient today.  Concerns regarding medicines are outlined above.  Medication changes, Labs and Tests ordered today are listed in the Patient Instructions below. Patient Instructions  Medication Instructions:  No changes *If you need a refill on your cardiac medications before your next appointment, please call your pharmacy*   Lab Work: None ordered If you have labs (blood work) drawn today and your tests are completely normal, you will receive your results only by: [place] (if you have MyChart) OR A paper copy in the mail If you have any lab test that is abnormal or we need to change your treatment, we will call you to review the results.   Testing/Procedures: None ordered   Follow-Up: At [facility], you and your health needs are our priority.  As part of our continuing mission to provide you with exceptional heart care, we have created designated  Provider Care Teams.  These Care Teams include your primary Cardiologist (physician) and Advanced Practice Providers (APPs -  Physician Assistants and Nurse Practitioners) who all work together to provide you with the care you need, when you need it.  We recommend signing up for the patient portal called "MyChart".  Sign up information is provided on this After Visit Summary.  MyChart is used to connect with patients for Virtual Visits (Telemedicine).  Patients are able to view lab/test results, encounter notes, upcoming appointments, etc.  Non-urgent messages can be sent to your provider as well.   To learn more about what you can do with MyChart, go to [email or web address].    Your next appointment:   12 month(s)  The format for your next appointment:   In Person  Provider:   Sanda Klein, MD {       Signed, Sanda Klein, MD  12/16/2021 12:36 PM    [address], [place], [place]  [address] Phone: [phone]; Fax: [phone]

## 2021-12-28 ENCOUNTER — Other Ambulatory Visit: Payer: Self-pay | Admitting: Cardiovascular Disease

## 2021-12-30 NOTE — Telephone Encounter (Signed)
I have sent Dr. Royann Shivers a secure chat I regard to if he has cleared the pt for dental procedure as I do not see this noted in his ov note 12/16/21. Once I hear back from MD if pt cleared I will be happy to call the pt and let her know of clearance as well as fax notes to requesting office.

## 2021-12-30 NOTE — Telephone Encounter (Signed)
Patient called wanting to know when will the dental offer receive the clearance so she can have her extraction done.

## 2021-12-31 NOTE — Telephone Encounter (Addendum)
° °  Patient Name: Joy Le  DOB: 1976/10/18 MRN: [id number]  Primary Cardiologist: Thurmon Fair, MD  Chart reviewed as part of pre-operative protocol coverage.  Patient was recently cleared for this procedure by Cadence Furth, PA, on 12/10/2021.   Simple dental extractions are considered low risk procedures per guidelines and generally do not require any specific cardiac clearance. It is also generally accepted that for simple extractions and dental cleanings, there is no need to interrupt blood thinner therapy.  SBE prophylaxis is not required for the patient from a cardiac standpoint.  I will route this note as well as previous clearance recommendation to the requesting party via Epic fax function and remove from pre-op pool.  Please call with questions.  [name], NP 12/31/2021, 8:50 AM

## 2021-12-31 NOTE — Telephone Encounter (Signed)
I will send to pre op provider for assistance to see if pt has been cleared. See my notes from yesterday.

## 2022-01-10 ENCOUNTER — Other Ambulatory Visit: Payer: Self-pay | Admitting: Cardiovascular Disease

## 2022-01-24 ENCOUNTER — Other Ambulatory Visit: Payer: Self-pay | Admitting: Cardiovascular Disease

## 2022-02-03 ENCOUNTER — Other Ambulatory Visit: Payer: Self-pay | Admitting: Cardiovascular Disease

## 2022-02-22 ENCOUNTER — Other Ambulatory Visit: Payer: Self-pay | Admitting: Cardiovascular Disease

## 2022-02-22 DIAGNOSIS — I480 Paroxysmal atrial fibrillation: Secondary | ICD-10-CM

## 2022-02-22 NOTE — Telephone Encounter (Signed)
Prescription refill request for Eliquis received. ?Indication: Afib  ?Last office visit:12/16/21 ([facility])  ?Scr: 0.61 (06/09/21)  ?Age: 46 ?Weight: 120.8kg ? ?Appropriate dose and refill sent to requested pharmacy.  ?

## 2022-03-28 ENCOUNTER — Other Ambulatory Visit: Payer: Self-pay | Admitting: Cardiovascular Disease

## 2022-03-30 ENCOUNTER — Other Ambulatory Visit: Payer: Self-pay | Admitting: Cardiovascular Disease

## 2022-08-13 ENCOUNTER — Other Ambulatory Visit: Payer: Self-pay | Admitting: Cardiovascular Disease

## 2022-08-13 DIAGNOSIS — I480 Paroxysmal atrial fibrillation: Secondary | ICD-10-CM

## 2022-08-15 NOTE — Telephone Encounter (Signed)
Prescription refill request for Eliquis received. Indication: PAF Last office visit: 12/16/21  Jerilynn Mages [facility]itoru MD Scr: 0.57 on 03/09/22 Age: 46 Weight: 120.8kg  Based on above findings Eliquis 5mg  twice daily is the appropriate dose.  Refill approved.

## 2023-01-15 ENCOUNTER — Other Ambulatory Visit: Payer: Self-pay | Admitting: Cardiovascular Disease

## 2023-01-22 ENCOUNTER — Other Ambulatory Visit: Payer: Self-pay | Admitting: Cardiovascular Disease

## 2023-01-22 DIAGNOSIS — I480 Paroxysmal atrial fibrillation: Secondary | ICD-10-CM

## 2023-01-23 NOTE — Telephone Encounter (Signed)
Schedulers left a message for the pt to call regarding scheduling an appointment.

## 2023-01-23 NOTE — Telephone Encounter (Signed)
Pt last saw Dr Sallyanne Kuster on 12/16/21, pt is overdue for follow-up.  Pt needs follow-up appt for refills.  12 month recall in [place], msg sent to schedulers.  Will await appt to refill rx.  Last labs 12/03/22 Creat 0.59, age 47, weight 120.8kg, based on specified criteria pt is on appropriate dosage of Eliquis 5mg  BID for afib.

## 2023-01-24 NOTE — Telephone Encounter (Addendum)
Called and spoke with pt and updated her that she needs an appointment with her Physician. She was transferred to set an appointment and is aware that the refill can be taken care of momentarily.  47yrs old, wt-120.8kg, Crea-0.59 on 01/07/23 via [place]; eliquis 5mg  bid refill will be sent.  Pt has an appt scheduled for 06/14/23 with Dr. Sallyanne Kuster. Will sent in a refill at this time to get appointment.

## 2023-02-04 ENCOUNTER — Other Ambulatory Visit: Payer: Self-pay | Admitting: Cardiovascular Disease

## 2023-04-14 ENCOUNTER — Other Ambulatory Visit: Payer: Self-pay | Admitting: Cardiovascular Disease

## 2023-04-30 ENCOUNTER — Other Ambulatory Visit: Payer: Self-pay | Admitting: Cardiovascular Disease

## 2023-06-06 ENCOUNTER — Other Ambulatory Visit: Payer: Self-pay | Admitting: Cardiovascular Disease

## 2023-06-09 ENCOUNTER — Other Ambulatory Visit: Payer: Self-pay | Admitting: Cardiovascular Disease

## 2023-06-11 ENCOUNTER — Other Ambulatory Visit: Payer: Self-pay | Admitting: Cardiovascular Disease

## 2023-06-12 ENCOUNTER — Other Ambulatory Visit: Payer: Self-pay | Admitting: Cardiovascular Disease

## 2023-06-14 ENCOUNTER — Ambulatory Visit: Payer: BC Managed Care – PPO | Attending: Cardiovascular Disease | Admitting: Cardiovascular Disease

## 2023-06-14 ENCOUNTER — Encounter: Payer: Self-pay | Admitting: Cardiovascular Disease

## 2023-06-14 VITALS — BP 132/80 | HR 86 | Ht 62.5 in | Wt 258.0 lb

## 2023-06-14 DIAGNOSIS — I480 Paroxysmal atrial fibrillation: Secondary | ICD-10-CM | POA: Diagnosis not present

## 2023-06-14 DIAGNOSIS — I10 Essential (primary) hypertension: Secondary | ICD-10-CM

## 2023-06-14 DIAGNOSIS — I340 Nonrheumatic mitral (valve) insufficiency: Secondary | ICD-10-CM

## 2023-06-14 DIAGNOSIS — E119 Type 2 diabetes mellitus without complications: Secondary | ICD-10-CM

## 2023-06-14 DIAGNOSIS — Z8774 Personal history of (corrected) congenital malformations of heart and circulatory system: Secondary | ICD-10-CM

## 2023-06-14 DIAGNOSIS — D6869 Other thrombophilia: Secondary | ICD-10-CM

## 2023-06-14 DIAGNOSIS — N200 Calculus of kidney: Secondary | ICD-10-CM

## 2023-06-14 NOTE — Progress Notes (Signed)
Patient ID: Joy Le, female   DOB: 1976-07-24, 47 y.o.   MRN: [id number]     Cardiology Office Note    Date:  06/14/2023   ID:  Joy Le, Joy Le Sep 02, 1976, MRN [id number]  PCP:  Laqueta Due., MD  Cardiologist:   Thurmon Fair, MD   Chief Complaint  Patient presents with   Atrial Fibrillation    History of Present Illness:  Joy Le is a 47 y.o. female with a history of ostium primum atrial septal defect and congenital mitral valve insufficiency repaired surgically at age 43 who has had problems with intermittent paroxysmal atrial fibrillation.   She has had an uneventful year from a cardiovascular point of view.  She is rarely bothered by very brief palpitations.  She does not think she has had any atrial fibrillation.  She has a smart watch, but is not sure that it has ECG capabilities.  She denies any problems shortness of breath or chest pain at rest or with activity.  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  She does not feel limited by her breathing.  Denies edema, syncope, dizziness.  Has not had recent problems of nephrolithiasis and has not had menorrhagia since her endometrial ablation.  Has been told that she has some "protein in her urine".  She is now on Ozempic in addition to Januvia and metformin and has good glycemic control.  Unfortunately she remains morbidly obese with a BMI of 46.  Echocardiogram in August 2022 showed moderate LVH with normal left ventricular systolic function and only mild mitral insufficiency.  History of cough with ACE inhibitors, but tolerates losartan well.    Past Medical History:  Diagnosis Date   Anticoagulant long-term use    Eliquis   Complication of anesthesia    slow to wake   First degree heart block    Heart murmur    congenital --  per dr Kaaliyah Kita (cardiologist) note murmur not easily audible on exam and did not have evidence of left  ventricular dilation   History of gestational diabetes 2008   History of kidney stones    History of repair of congenital atrial septal defect (ASD) 78  age 60---  cardiologist- dr Haywood Meinders   repair ostium primum atrial septal defect (no residual effects per last echo, long AV conduction time consistant w/ previous ostium primum) associated cleft anterior mitral leaflet , repaired    Iron deficiency anemia    Menorrhagia with irregular cycle    Mitral regurgitation    pt is s/p repair cleft anterior Mitral leaflet / per last echo 03-29-2011  mild to moderate MR w/ improvement since last echo 05/ 2011, now mostly mild   Nephrolithiasis urologist-  dr Marlou Porch   left side non-obstructive per Renal US 04/ 2017   PAF (paroxysmal atrial fibrillation) [facility]) cardiologist-  dr Mckennon Zwart   DCCV 02-04-2011 successful (does note have valvular atrial fib.)   Systemic hypertension    Type 2 diabetes mellitus (HCC)     Past Surgical History:  Procedure Laterality Date   ASD REPAIR, OSTIUM PRIMUM  48  age 72    [facility]   and repaired cleft anterior mitral valve leaflet defect (no prosthesis)   CARDIAC CATHETERIZATION  04/27/2005   No sign. CAD,mod. MR,elevated LV end-diastolic pressure,EF 50% w/mild anterolateral hypokinesis   CARDIOVASCULAR STRESS TEST  03/09/2005   Intermediate risk nuclear perfusion study w/ a primary  fixed apical defect , represents either scar or marked apical thinning/  normal LV function and wall motion , ef 67%   CARDIOVERSION  01/25/2011   successful   CESAREAN SECTION  08/02/2007   CESAREAN SECTION WITH BILATERAL TUBAL LIGATION Bilateral 07/04/2013   Procedure: REPEAT CESAREAN SECTION WITH BILATERAL TUBAL LIGATION;  Surgeon: Kirkland Hun, MD;  Location: [facility];  Service: Obstetrics;  Laterality: Bilateral;   CYSTOSCOPY/RETROGRADE/URETEROSCOPY/STONE EXTRACTION WITH BASKET Right 01/08/2016   Procedure: RIGHT URETEROSCOPY/LASER LITHOTRIPSY/STONE EXTRACTION WITH RETROGRADE PYELOGRAM  AND RIGHT STENT PLACEMENT;  Surgeon: Crist Fat, MD;  Location: [facility];  Service: Urology;  Laterality: Right;   DILITATION & CURRETTAGE/HYSTROSCOPY WITH NOVASURE ABLATION N/A 05/30/2017   Procedure: DILATATION & CURETTAGE/HYSTEROSCOPY WITH NOVASURE ABLATION;  Surgeon: Kirkland Hun, MD;  Location: [facility];  Service: Gynecology;  Laterality: N/A;   HOLMIUM LASER APPLICATION N/A 01/08/2016   Procedure: HOLMIUM LASER APPLICATION;  Surgeon: Crist Fat, MD;  Location: [facility];  Service: Urology;  Laterality: N/A;   TRANSTHORACIC ECHOCARDIOGRAM  03-29-2011  dr Tylin Force   mild concentricl LVH,  ef 60-65%,  E/A fusion due to 1st degree AVB, normal wall motion/ mild to moderate MR, posteriorly directed jet/  mild TR/  normal RVSP (compared to prior echo 05/ 2011 mild improvment in the degree MR from moderate to more likely mild)   WISDOM TOOTH EXTRACTION      Outpatient Medications Prior to Visit  Medication Sig Dispense Refill   apixaban (ELIQUIS) 5 MG TABS tablet Take 1 tablet by mouth twice daily 180 tablet 1   Blood Glucose Monitoring Suppl (GLUCOCOM BLOOD GLUCOSE MONITOR) DEVI Use as directed to check blood sugars once daily.     cetirizine (ZYRTEC) 10 MG tablet Take 10 mg by mouth daily.     CHOLECALCIFEROL PO Take 1 tablet by mouth daily in the afternoon.     CINNAMON PO Take 2 capsules by mouth every morning.      Cyanocobalamin (B-12) 1000 MCG SUBL Place under the tongue daily.     diltiazem (CARDIZEM CD) 120 MG 24 hr capsule Take 1 capsule (120 mg total) by mouth daily. 30 capsule 0   fluticasone (FLONASE) 50 MCG/ACT nasal spray Place 1 spray into both nostrils daily.     JANUVIA 100 MG tablet Take 1 tablet by mouth every morning.  3   losartan (COZAAR) 50 MG tablet Take 1 tablet by mouth once daily 30 tablet 0   metFORMIN (GLUCOPHAGE) 500 MG tablet Take 1,000 mg by mouth 2 (two) times daily with a meal.     metoprolol succinate (TOPROL-XL) 50 MG 24 hr  tablet Take 1 tablet (50 mg total) by mouth daily. 30 tablet 0   amoxicillin (AMOXIL) 500 MG capsule TAKE FOUR CAPSULES BY MOUTH ONE HOUR BEFORE APPOINTMENT (Patient not taking: Reported on 12/16/2021)     fluconazole (DIFLUCAN) 150 MG tablet Take 150 mg by mouth once. (Patient not taking: Reported on 12/16/2021)     penicillin v potassium (VEETID) 500 MG tablet Take 500 mg by mouth every 6 (six) hours. (Patient not taking: Reported on 06/14/2023)     No facility-administered medications prior to visit.     Allergies:   Codeine   Social History   Socioeconomic History   Marital status: Married    Spouse name: Joy Micke "BJ"   Number of children: 1   Years of education: 20   Highest education level: Not on file  Occupational History   Occupation:  Teacher    Employer: [employer]  Tobacco Use   Smoking status: Never   Smokeless tobacco: Never  Substance and Sexual Activity   Alcohol use: No   Drug use: No   Sexual activity: Yes    Partners: Male    Birth control/protection: Surgical    Comment: BTL  Other Topics Concern   Not on file  Social History Narrative   Not on file   Social Determinants of Health   Financial Resource Strain: Not on file  Food Insecurity: Not on file  Transportation Needs: Not on file  Physical Activity: Not on file  Stress: Not on file  Social Connections: Unknown (03/09/2022)   Received from [facility]   Social Network    Social Network: Not on file     Family History:  The patient's family history includes Alcohol abuse in her paternal grandfather; Alzheimer's disease in her maternal grandfather; Anemia in her sister; Diabetes in her maternal aunt, maternal grandmother, mother, and paternal aunt; Hypertension in her father, mother, and sister; Lung cancer in her paternal grandfather; Stroke in her mother.   ROS:   Please see the history of present illness.    All other systems are reviewed and are  negative.   PHYSICAL EXAM:   VS:  BP 132/80   Pulse 86   Ht 5' 2.5" (1.588 m)   Wt 258 lb (117 kg)   BMI 46.44 kg/m       General: Alert, oriented x3, no distress, morbidly obese Head: no evidence of trauma, PERRL, EOMI, no exophtalmos or lid lag, no myxedema, no xanthelasma; normal ears, nose and oropharynx Neck: normal jugular venous pulsations and no hepatojugular reflux; brisk carotid pulses without delay and no carotid bruits Chest: clear to auscultation, no signs of consolidation by percussion or palpation, normal fremitus, symmetrical and full respiratory excursions Cardiovascular: normal position and quality of the apical impulse, regular rhythm, normal first and second heart sounds, no murmurs, rubs or gallops Abdomen: no tenderness or distention, no masses by palpation, no abnormal pulsatility or arterial bruits, normal bowel sounds, no hepatosplenomegaly Extremities: no clubbing, cyanosis or edema; 2+ radial, ulnar and brachial pulses bilaterally; 2+ right femoral, posterior tibial and dorsalis pedis pulses; 2+ left femoral, posterior tibial and dorsalis pedis pulses; no subclavian or femoral bruits Neurological: grossly nonfocal Psych: Normal mood and affect     Wt Readings from Last 3 Encounters:  06/14/23 258 lb (117 kg)  12/16/21 266 lb 6.4 oz (120.8 kg)  09/30/20 275 lb (124.7 kg)    Studies/Labs Reviewed:   EKG:  EKG is ordered today.  Is very similar to previous tracing showing sinus rhythm with long first-degree AV block, left atrial abnormality, left anterior fascicular block with QRS broadening.  ECHO 06/18/2021:   1. Left ventricular ejection fraction, by estimation, is 60 to 65%. The  left ventricle has normal function. The left ventricle has no regional  wall motion abnormalities. There is moderate concentric left ventricular  hypertrophy. Indeterminate diastolic  filling due to E-A fusion.   2. Right ventricular systolic function is normal. The right  ventricular  size is normal. Tricuspid regurgitation signal is inadequate for assessing  PA pressure.   3. Left atrial size was mildly dilated.   4. The mitral valve is grossly normal. Mild mitral valve regurgitation.  No evidence of mitral stenosis.   5. The aortic valve is tricuspid. Aortic valve regurgitation is not  visualized. No aortic stenosis is present.  6. The inferior vena cava is normal in size with greater than 50%  respiratory variability, suggesting right atrial pressure of 3 mmHg.   Comparison(s): No significant change from prior study.  Labs 06/09/2021 Glucose 115, creatinine 0.61, potassium 4.3, normal liver function tests globin 13.8 Cholesterol 165, triglycerides 104, HDL 55, LDL direct 91   12/03/2022  K 4.1, creat 0.59, normal LFTs, A1c 6.0% Cholesterol 145, triglycerides 92, HDL 52, LDL 80    ASSESSMENT:    1. PAF (paroxysmal atrial fibrillation) (HCC)   2. Acquired thrombophilia (HCC)   3. Nonrheumatic mitral valve regurgitation   4. History of repair of congenital atrial septal defect (ASD)   5. Essential hypertension   6. Non-insulin dependent type 2 diabetes mellitus (HCC)   7. Morbid obesity (HCC)   8. Nephrolithiasis       PLAN:  In order of problems listed above:  AFib: She has not had any clinically evident episodes and denies neurological events.  CHA2DS2-VASc 3 (gender, HTN, DM). Eliquis: No bleeding problems. MR: Mild MR, possibly remnant from repair of ostium primum defect, although no obvious cleft in the valve.  Plan echo every 3-5 years, sooner if she has symptoms.  Check before next appointment. ASD: No evidence of residual defect by echo.  Has long first-degree AV block and left axis deviation consistent with a previous ostium primum defect. HTN: Good control, target BP less than 130/80. DM: She reports that her last hemoglobin A1c was 6.0%.  All lipid parameters in target range.  Now on Ozempic in addition to Januvia and  metformin. Obesity: Hopefully semaglutide will help with weight loss.  She remains morbidly obese at this time. Nephrolithiasis: Has not had problems nephrolithiasis/hematuria in the last 12 months.  Medication Adjustments/Labs and Tests Ordered: Current medicines are reviewed at length with the patient today.  Concerns regarding medicines are outlined above.  Medication changes, Labs and Tests ordered today are listed in the Patient Instructions below. Patient Instructions  Medication Instructions:  Your physician recommends that you continue on your current medications as directed. Please refer to the Current Medication list given to you today.  *If you need a refill on your cardiac medications before your next appointment, please call your pharmacy*  Testing/Procedures: Your physician has requested that you have an echocardiogram in August 2025. Echocardiography is a painless test that uses sound waves to create images of your heart. It provides your doctor with information about the size and shape of your heart and how well your heart's chambers and valves are working. This procedure takes approximately one hour. There are no restrictions for this procedure. Please do NOT wear cologne, perfume, aftershave, or lotions (deodorant is allowed). Please arrive 15 minutes prior to your appointment time.   Follow-Up: At [facility], you and your health needs are our priority.  As part of our continuing mission to provide you with exceptional heart care, we have created designated Provider Care Teams.  These Care Teams include your primary Cardiologist (physician) and Advanced Practice Providers (APPs -  Physician Assistants and Nurse Practitioners) who all work together to provide you with the care you need, when you need it.  Your next appointment:   1 year  Provider:   Thurmon Fair, MD        Signed, Thurmon Fair, MD  06/14/2023 8:46 AM    [facility]  [facility] [address] [place], [place], [place]  [address] Phone: [phone]; Fax: [phone]

## 2023-06-14 NOTE — Patient Instructions (Signed)
Medication Instructions:  Your physician recommends that you continue on your current medications as directed. Please refer to the Current Medication list given to you today.  *If you need a refill on your cardiac medications before your next appointment, please call your pharmacy*  Testing/Procedures: Your physician has requested that you have an echocardiogram in August 2025. Echocardiography is a painless test that uses sound waves to create images of your heart. It provides your doctor with information about the size and shape of your heart and how well your heart's chambers and valves are working. This procedure takes approximately one hour. There are no restrictions for this procedure. Please do NOT wear cologne, perfume, aftershave, or lotions (deodorant is allowed). Please arrive 15 minutes prior to your appointment time.   Follow-Up: At [facility], you and your health needs are our priority.  As part of our continuing mission to provide you with exceptional heart care, we have created designated Provider Care Teams.  These Care Teams include your primary Cardiologist (physician) and Advanced Practice Providers (APPs -  Physician Assistants and Nurse Practitioners) who all work together to provide you with the care you need, when you need it.  Your next appointment:   1 year  Provider:   Thurmon Fair, MD

## 2023-07-08 ENCOUNTER — Other Ambulatory Visit: Payer: Self-pay | Admitting: Cardiovascular Disease

## 2023-07-09 ENCOUNTER — Other Ambulatory Visit: Payer: Self-pay | Admitting: Cardiovascular Disease

## 2023-07-16 ENCOUNTER — Other Ambulatory Visit: Payer: Self-pay | Admitting: Cardiovascular Disease

## 2023-07-16 DIAGNOSIS — I480 Paroxysmal atrial fibrillation: Secondary | ICD-10-CM

## 2023-07-17 NOTE — Telephone Encounter (Signed)
Prescription refill request for Eliquis received. Indication:afib Last office visit:8/24 Scr:0.59  1/24 Age: 47 Weight:117  kg  Prescription refilled

## 2023-07-23 ENCOUNTER — Other Ambulatory Visit: Payer: Self-pay | Admitting: Cardiovascular Disease

## 2023-08-10 ENCOUNTER — Other Ambulatory Visit: Payer: Self-pay | Admitting: Cardiovascular Disease

## 2024-01-18 ENCOUNTER — Other Ambulatory Visit: Payer: Self-pay | Admitting: Cardiovascular Disease

## 2024-01-18 DIAGNOSIS — I480 Paroxysmal atrial fibrillation: Secondary | ICD-10-CM

## 2024-01-18 NOTE — Telephone Encounter (Signed)
 Prescription refill request for Eliquis received. Indication: Afib  Last office visit: 06/14/23 (Croitoru)  Scr: 0.59 (12/03/22)  Age: 48 Weight: 117kg  Appropriate dose. Refill sent.

## 2024-05-02 ENCOUNTER — Other Ambulatory Visit: Payer: Self-pay | Admitting: Cardiovascular Disease

## 2024-06-13 ENCOUNTER — Ambulatory Visit: Payer: Self-pay | Admitting: Cardiovascular Disease

## 2024-06-13 ENCOUNTER — Ambulatory Visit (HOSPITAL_COMMUNITY)
Admission: RE | Admit: 2024-06-13 | Discharge: 2024-06-13 | Disposition: A | Discharge status: Home / Self Care | Payer: Self-pay | Source: Ambulatory Visit | Attending: Cardiology | Admitting: Cardiology

## 2024-06-13 DIAGNOSIS — I340 Nonrheumatic mitral (valve) insufficiency: Secondary | ICD-10-CM | POA: Insufficient documentation

## 2024-06-13 DIAGNOSIS — Z8774 Personal history of (corrected) congenital malformations of heart and circulatory system: Secondary | ICD-10-CM | POA: Diagnosis present

## 2024-06-13 LAB — ECHOCARDIOGRAM COMPLETE
AR max vel: 2.33 cm2
AV Area VTI: 2.26 cm2
AV Area mean vel: 2.27 cm2
AV Mean grad: 7 mmHg
AV Peak grad: 13.8 mmHg
Ao pk vel: 1.86 m/s
Area-P 1/2: 3.48 cm2
S' Lateral: 2.6 cm

## 2024-06-29 ENCOUNTER — Other Ambulatory Visit: Payer: Self-pay | Admitting: Cardiovascular Disease

## 2024-07-01 ENCOUNTER — Other Ambulatory Visit: Payer: Self-pay | Admitting: Cardiovascular Disease

## 2024-07-04 ENCOUNTER — Telehealth: Payer: Self-pay

## 2024-07-04 NOTE — Telephone Encounter (Signed)
   Pre-operative Risk Assessment    Patient Name: Joy Le  DOB: [birth year] MRN: [id number]   Date of last office visit: 06/14/2023 Croitoru, MD Date of next office visit: NA   Request for Surgical Clearance    Procedure:  Colonoscopy  Date of Surgery:  Clearance TBD                                 Surgeon:  Dr. Ladora Surgeon's Group or Practice Name:  [facility]  Phone number:  [phone] Fax number:  [phone]   Type of Clearance Requested:   - Medical  - Pharmacy:  Hold Apixaban  (Eliquis ) Hold for 2 days   Type of Anesthesia:  not indicated   Additional requests/questions:    Bonney Ted Muskrat   07/04/2024, 4:53 PM

## 2024-07-05 NOTE — Telephone Encounter (Signed)
   Name: Joy Le  DOB: Dec 02, 1975  MRN: [id number]  Primary Cardiologist: Jerel Balding, MD  Chart reviewed as part of pre-operative protocol coverage. Because of [name]'s past medical history and time since last visit, she will require a follow-up in-office visit in order to better assess preoperative cardiovascular risk.Last seen by Dr. Balding on 06/14/2023.  Pre-op covering staff: - Please schedule appointment and call patient to inform them. If patient already had an upcoming appointment within acceptable timeframe, please add pre-op clearance to the appointment notes so provider is aware. - Please contact requesting surgeon's office via preferred method (i.e, phone, fax) to inform them of need for appointment prior to surgery.   Per pharmacy on 07/05/2024 Patient has not  had an Afib/aflutter ablation within the last 3 months or DCCV within the last 30 days Per office protocol, patient can hold Eliquis  for 2 days prior to procedure.      Lamarr Satterfield DNP, ANP, AACC  07/05/2024, 3:45 PM

## 2024-07-05 NOTE — Telephone Encounter (Signed)
 Patient with diagnosis of afib on Eliquis  for anticoagulation.    Procedure: Colonoscopy  Date of procedure: TBD   CHA2DS2-VASc Score = 3   This indicates a 3.2% annual risk of stroke. The patient's score is based upon: CHF History: 0 HTN History: 1 Diabetes History: 1 Stroke History: 0 Vascular Disease History: 0 Age Score: 0 Gender Score: 1     CrCl 130 mL/min Platelet count 223 K  Patient has not  had an Afib/aflutter ablation within the last 3 months or DCCV within the last 30 days   Per office protocol, patient can hold Eliquis  for 2 days prior to procedure.     **This guidance is not considered finalized until pre-operative APP has relayed final recommendations.**

## 2024-07-05 NOTE — Telephone Encounter (Signed)
 Patient is scheduled for 09/30/2024. Appointment was scheduled this far out because patient preferred to only see Dr. JAYSON.

## 2024-07-05 NOTE — Telephone Encounter (Signed)
 Pharmacy please advise on holding Eliquis  prior to colonoscopy scheduled for TBD. Last labs (CBC and CMET) on 05/06/2024. Thank you.

## 2024-07-10 ENCOUNTER — Other Ambulatory Visit: Payer: Self-pay | Admitting: Cardiovascular Disease

## 2024-07-10 DIAGNOSIS — I480 Paroxysmal atrial fibrillation: Secondary | ICD-10-CM

## 2024-07-10 NOTE — Telephone Encounter (Signed)
 Prescription refill request for Eliquis  received. Indication:afib Last office visit:upcoming Scr:0.66  6/25 Age: 48 Weight:117  kg  Prescription refilled

## 2024-07-21 ENCOUNTER — Other Ambulatory Visit: Payer: Self-pay | Admitting: Cardiovascular Disease

## 2024-08-02 ENCOUNTER — Other Ambulatory Visit: Payer: Self-pay | Admitting: Cardiovascular Disease

## 2024-09-28 ENCOUNTER — Other Ambulatory Visit: Payer: Self-pay | Admitting: Cardiovascular Disease

## 2024-09-30 ENCOUNTER — Encounter: Payer: Self-pay | Admitting: Cardiovascular Disease

## 2024-09-30 ENCOUNTER — Ambulatory Visit: Attending: Cardiovascular Disease | Admitting: Cardiovascular Disease

## 2024-09-30 VITALS — BP 138/74 | HR 92 | Ht 62.5 in | Wt 266.0 lb

## 2024-09-30 DIAGNOSIS — Z8774 Personal history of (corrected) congenital malformations of heart and circulatory system: Secondary | ICD-10-CM

## 2024-09-30 DIAGNOSIS — I340 Nonrheumatic mitral (valve) insufficiency: Secondary | ICD-10-CM | POA: Diagnosis not present

## 2024-09-30 DIAGNOSIS — I480 Paroxysmal atrial fibrillation: Secondary | ICD-10-CM | POA: Diagnosis not present

## 2024-09-30 DIAGNOSIS — E119 Type 2 diabetes mellitus without complications: Secondary | ICD-10-CM

## 2024-09-30 DIAGNOSIS — I10 Essential (primary) hypertension: Secondary | ICD-10-CM

## 2024-09-30 DIAGNOSIS — D6869 Other thrombophilia: Secondary | ICD-10-CM

## 2024-09-30 DIAGNOSIS — N200 Calculus of kidney: Secondary | ICD-10-CM

## 2024-09-30 NOTE — Patient Instructions (Signed)

## 2024-09-30 NOTE — Progress Notes (Signed)
 Patient ID: Joy Le, female   DOB: 03/06/1976, 48 y.o.   MRN: [id number]     Cardiology Office Note    Date:  09/30/2024   ID:  Joy Le, Joy Le January 04, 1976, MRN [id number]  PCP:  Deane Camie CHRISTELLA., MD  Cardiologist:   Jerel Balding, MD   Chief Complaint  Patient presents with   Cardiac Valve Problem    History of Present Illness:  Joy Le is a 48 y.o. female with a history of ostium primum atrial septal defect and congenital mitral valve insufficiency repaired surgically at age 8 who has had problems with intermittent paroxysmal atrial fibrillation.   She is doing quite well.  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  Has not had problems with chest pain or shortness of breath.  No lower extremity edema.  Denies palpitations, dizziness, syncope, hematuria or menorrhagia on Eliquis , no falls or other bleeding problems.  Remains morbidly obese and weight has stagnated around 260 pounds despite taking Ozempic.  All her metabolic parameters however look pretty good with A1c 6.0%, LDL 91, HDL 60, normal triglycerides and normal creatinine.  Has a tiny amount of proteinuria.  Follow-up echo earlier this year shows moderate LVH, no evidence of residual ASD, trivial mitral insufficiency, but more likely mild or mild-moderate on my evaluation.  History of cough with ACE inhibitors, but tolerates losartan  well.    Past Medical History:  Diagnosis Date   Anticoagulant long-term use    Eliquis    Complication of anesthesia    slow to wake   First degree heart block    Heart murmur    congenital --  per dr Ivett Luebbe (cardiologist) note murmur not easily audible on exam and did not have evidence of left ventricular dilation   History of gestational diabetes 2008   History of kidney stones    History of repair of congenital atrial septal defect (ASD) 46  age 67---  cardiologist- dr Koleman Marling   repair ostium primum atrial  septal defect (no residual effects per last echo, long AV conduction time consistant w/ previous ostium primum) associated cleft anterior mitral leaflet , repaired    Iron deficiency anemia    Menorrhagia with irregular cycle    Mitral regurgitation    pt is s/p repair cleft anterior Mitral leaflet / per last echo 03-29-2011  mild to moderate MR w/ improvement since last echo 05/ 2011, now mostly mild   Nephrolithiasis urologist-  dr cam   left side non-obstructive per Renal US  04/ 2017   PAF (paroxysmal atrial fibrillation) [facility]) cardiologist-  dr Megean Fabio   DCCV 02-04-2011 successful (does note have valvular atrial fib.)   Systemic hypertension    Type 2 diabetes mellitus (HCC)     Past Surgical History:  Procedure Laterality Date   ASD REPAIR, OSTIUM PRIMUM  77  age 38    [facility]   and repaired cleft anterior mitral valve leaflet defect (no prosthesis)   CARDIAC CATHETERIZATION  04/27/2005   No sign. CAD,mod. MR,elevated LV end-diastolic pressure,EF 50% w/mild anterolateral hypokinesis   CARDIOVASCULAR STRESS TEST  03/09/2005   Intermediate risk nuclear perfusion study w/ a primary fixed apical defect , represents either scar or marked apical thinning/  normal LV function and wall motion , ef 67%   CARDIOVERSION  01/25/2011   successful   CESAREAN SECTION  08/02/2007   CESAREAN SECTION WITH BILATERAL TUBAL LIGATION Bilateral 07/04/2013   Procedure: REPEAT CESAREAN  SECTION WITH BILATERAL TUBAL LIGATION;  Surgeon: Rome Rigg, MD;  Location: [facility];  Service: Obstetrics;  Laterality: Bilateral;   CYSTOSCOPY/RETROGRADE/URETEROSCOPY/STONE EXTRACTION WITH BASKET Right 01/08/2016   Procedure: RIGHT URETEROSCOPY/LASER LITHOTRIPSY/STONE EXTRACTION WITH RETROGRADE PYELOGRAM AND RIGHT STENT PLACEMENT;  Surgeon: Morene LELON Salines, MD;  Location: [facility];  Service: Urology;  Laterality: Right;   DILITATION & CURRETTAGE/HYSTROSCOPY WITH NOVASURE ABLATION N/A 05/30/2017   Procedure: DILATATION &  CURETTAGE/HYSTEROSCOPY WITH NOVASURE ABLATION;  Surgeon: Rigg Rome, MD;  Location: [facility];  Service: Gynecology;  Laterality: N/A;   HOLMIUM LASER APPLICATION N/A 01/08/2016   Procedure: HOLMIUM LASER APPLICATION;  Surgeon: Morene LELON Salines, MD;  Location: [facility];  Service: Urology;  Laterality: N/A;   TRANSTHORACIC ECHOCARDIOGRAM  03-29-2011  dr Chevi Lim   mild concentricl LVH,  ef 60-65%,  E/A fusion due to 1st degree AVB, normal wall motion/ mild to moderate MR, posteriorly directed jet/  mild TR/  normal RVSP (compared to prior echo 05/ 2011 mild improvment in the degree MR from moderate to more likely mild)   WISDOM TOOTH EXTRACTION      Outpatient Medications Prior to Visit  Medication Sig Dispense Refill   apixaban  (ELIQUIS ) 5 MG TABS tablet Take 1 tablet by mouth twice daily 180 tablet 1   Blood Glucose Monitoring Suppl (GLUCOCOM BLOOD GLUCOSE MONITOR) DEVI Use as directed to check blood sugars once daily.     cetirizine (ZYRTEC) 10 MG tablet Take 10 mg by mouth daily.     CHOLECALCIFEROL PO Take 1 tablet by mouth daily in the afternoon.     CINNAMON PO Take 2 capsules by mouth every morning.      Cyanocobalamin  (B-12) 1000 MCG SUBL Place under the tongue daily.     diltiazem  (CARDIZEM  CD) 120 MG 24 hr capsule Take 1 capsule by mouth once daily 90 capsule 0   fluticasone (FLONASE) 50 MCG/ACT nasal spray Place 1 spray into both nostrils daily.     JANUVIA 100 MG tablet Take 1 tablet by mouth every morning.  3   losartan  (COZAAR ) 50 MG tablet Take 1 tablet (50 mg total) by mouth daily. Pt must keep follow up appt with Cardiology for any more refills. Thank You 60 tablet 0   metFORMIN  (GLUCOPHAGE ) 500 MG tablet Take 1,000 mg by mouth 2 (two) times daily with a meal.     metoprolol  succinate (TOPROL -XL) 50 MG 24 hr tablet Take 1 tablet (50 mg total) by mouth daily. Patient must keep follow up appt with Cardiology for any more refills. Thank You 60 tablet 0    OZEMPIC, 2 MG/DOSE, 8 MG/3ML SOPN INJECT 2MG  SUBCUTANEOUSLY ONCE A WEEK     amoxicillin (AMOXIL) 500 MG capsule TAKE FOUR CAPSULES BY MOUTH ONE HOUR BEFORE APPOINTMENT (Patient not taking: Reported on 09/30/2024)     fluconazole (DIFLUCAN) 150 MG tablet Take 150 mg by mouth once. (Patient not taking: Reported on 09/30/2024)     penicillin v potassium (VEETID) 500 MG tablet Take 500 mg by mouth every 6 (six) hours. (Patient not taking: Reported on 09/30/2024)     No facility-administered medications prior to visit.     Allergies:   Codeine   Social History   Socioeconomic History   Marital status: Married    Spouse name: Forestine Macho BJ   Number of children: 1   Years of education: 20   Highest education level: Not on file  Occupational History   Occupation: Magazine Features Editor: [employer]  Tobacco Use   Smoking status: Never   Smokeless tobacco: Never  Substance and Sexual Activity   Alcohol use: No   Drug use: No   Sexual activity: Yes    Partners: Male    Birth control/protection: Surgical    Comment: BTL  Other Topics Concern   Not on file  Social History Narrative   Not on file   Social Drivers of Health   Financial Resource Strain: Not on file  Food Insecurity: Low Risk  (09/17/2023)   Received from [facility]   Hunger Vital Sign    Within the past 12 months, you worried that your food would run out before you got money to buy more: Never true    Within the past 12 months, the food you bought just didn't last and you didn't have money to get more. : Never true  Transportation Needs: No Transportation Needs (09/17/2023)   Received from [facility]    In the past 12 months, has lack of reliable transportation kept you from medical appointments, meetings, work or from getting things needed for daily living? : No  Physical Activity: Not on file  Stress: Not on file  Social Connections: Unknown (03/09/2022)   Received from [facility]   Social Network    Social Network: Not on file     Family History:  The patient's family history includes Alcohol abuse in her paternal grandfather; Alzheimer's disease in her maternal grandfather; Anemia in her sister; Diabetes in her maternal aunt, maternal grandmother, mother, and paternal aunt; Hypertension in her father, mother, and sister; Lung cancer in her paternal grandfather; Stroke in her mother.   ROS:   Please see the history of present illness.    All other systems are reviewed and are negative.   PHYSICAL EXAM:   VS:  BP 138/74 (BP Location: Left Arm, Patient Position: Sitting, Cuff Size: Normal)   Pulse 92   Ht 5' 2.5 (1.588 m)   Wt 266 lb (120.7 kg)   SpO2 92%   BMI 47.88 kg/m       General: Alert, oriented x3, no distress, morbidly obese Head: no evidence of trauma, PERRL, EOMI, no exophtalmos or lid lag, no myxedema, no xanthelasma; normal ears, nose and oropharynx Neck: normal jugular venous pulsations and no hepatojugular reflux; brisk carotid pulses without delay and no carotid bruits Chest: clear to auscultation, no signs of consolidation by percussion or palpation, normal fremitus, symmetrical and full respiratory excursions Cardiovascular: normal position and quality of the apical impulse, regular rhythm, normal first and second heart sounds, no murmurs, rubs or gallops Abdomen: no tenderness or distention, no masses by palpation, no abnormal pulsatility or arterial bruits, normal bowel sounds, no hepatosplenomegaly Extremities: no clubbing, cyanosis or edema; 2+ radial, ulnar and brachial pulses bilaterally; 2+ right femoral, posterior tibial and dorsalis pedis pulses; 2+ left femoral, posterior tibial and dorsalis pedis pulses; no subclavian or femoral bruits Neurological: grossly nonfocal Psych: Normal mood and affect     Wt Readings from Last 3 Encounters:  09/30/24 266 lb (120.7 kg)  06/14/23 258 lb (117 kg)  12/16/21 266 lb 6.4 oz (120.8  kg)    Studies/Labs Reviewed:   EKG:   EKG Interpretation Date/Time:  Monday September 30 2024 08:23:56 EST Ventricular Rate:  92 PR Interval:  304 QRS Duration:  126 QT Interval:  332 QTC Calculation: 410 R Axis:   -37  Text Interpretation: Sinus rhythm with 1st degree  A-V block Possible Left atrial enlargement Left axis deviation Left ventricular hypertrophy with QRS widening ( Cornell product ) When compared with ECG of 14-Jun-2023 08:01, No significant change was found Confirmed by Quina Wilbourne ([id number]) on 09/30/2024 8:29:12 AM         ECHO 06/13/2024:   1. Left ventricular ejection fraction, by estimation, is 70 to 75%. The  left ventricle has hyperdynamic function. The left ventricle has no  regional wall motion abnormalities. There is mild left ventricular  hypertrophy. Left ventricular diastolic  parameters are indeterminate.   2. Right ventricular systolic function is normal. The right ventricular  size is normal.   3. Left atrial size was mildly dilated.   4. Trivial mitral valve regurgitation.   5. The aortic valve is tricuspid. Aortic valve regurgitation is not  visualized. Aortic valve sclerosis is present, with no evidence of aortic  valve stenosis.   6. The inferior vena cava is normal in size with greater than 50%  respiratory variability, suggesting right atrial pressure of 3 mmHg.   Comparison(s): The left ventricular function is unchanged.   Labs 06/09/2021 Glucose 115, creatinine 0.61, potassium 4.3, normal liver function tests globin 13.8 Cholesterol 165, triglycerides 104, HDL 55, LDL direct 91   12/03/2022  K 4.1, creat 0.59, normal LFTs, A1c 6.0% Cholesterol 145, triglycerides 92, HDL 52, LDL 80  05/06/2024 Cholesterol 168, HDL 60, LDL 91, triglycerides 80 Hemoglobin A1c 6.0% Creatinine 0.66, potassium 4.4  ASSESSMENT:    1. PAF (paroxysmal atrial fibrillation) (HCC)   2. Acquired thrombophilia   3. Nonrheumatic mitral valve  regurgitation   4. History of repair of congenital atrial septal defect (ASD)   5. Essential hypertension   6. Non-insulin dependent type 2 diabetes mellitus (HCC)   7. Morbid obesity (HCC)   8. Nephrolithiasis       PLAN:  In order of problems listed above:  AFib: Has not been aware of any arrhythmia since her last appointment.  No neurological events.  CHA2DS2-VASc 3 (gender, HTN, DM). Eliquis : No bleeding problems. MR: Described as trivial on most recent echo but it is probably at least mild.   possibly remnant from repair of ostium primum defect, although no obvious cleft in the valve.  Plan echo every 3-5 years, sooner only if she has symptoms.   ASD: No evidence of any residual shunting.  Has long first-degree AV block and left axis deviation consistent with a previous ostium primum defect. HTN: Ideal blood pressure should be less than 130/80.  Just slightly above target today.  Was 132/80 at her last office appointment.  No changes made to her medications.  Avoid high sodium foods DM: Well-controlled with hemoglobin A1c 6.0%.  All lipid parameters are in target range. Obesity: She has lost weight with the semaglutide, but unfortunately remains in morbidly obese range.  She appears to be quite fit despite this.   Nephrolithiasis: No recent problems with hematuria on Eliquis .  Medication Adjustments/Labs and Tests Ordered: Current medicines are reviewed at length with the patient today.  Concerns regarding medicines are outlined above.  Medication changes, Labs and Tests ordered today are listed in the Patient Instructions below. Patient Instructions  Medication Instructions:  No changes *If you need a refill on your cardiac medications before your next appointment, please call your pharmacy*  Lab Work: None ordered If you have labs (blood work) drawn today and your tests are completely normal, you will receive your results only by: MyChart Message (if you have MyChart)  OR A  paper copy in the mail If you have any lab test that is abnormal or we need to change your treatment, we will call you to review the results.  Testing/Procedures: None ordered  Follow-Up: At [facility], you and your health needs are our priority.  As part of our continuing mission to provide you with exceptional heart care, our providers are all part of one team.  This team includes your primary Cardiologist (physician) and Advanced Practice Providers or APPs (Physician Assistants and Nurse Practitioners) who all work together to provide you with the care you need, when you need it.  Your next appointment:   1 year(s)  Provider:   Jerel Balding, MD    We recommend signing up for the patient portal called MyChart.  Sign up information is provided on this After Visit Summary.  MyChart is used to connect with patients for Virtual Visits (Telemedicine).  Patients are able to view lab/test results, encounter notes, upcoming appointments, etc.  Non-urgent messages can be sent to your provider as well.   To learn more about what you can do with MyChart, go to [email or web address].        Signed, Jerel Balding, MD  09/30/2024 11:24 AM    [facility] [address] [place], [place], [place]  [address] Phone: [phone]; Fax: [phone]

## 2024-10-16 ENCOUNTER — Other Ambulatory Visit: Payer: Self-pay | Admitting: Cardiovascular Disease

## 2024-10-18 ENCOUNTER — Telehealth (HOSPITAL_BASED_OUTPATIENT_CLINIC_OR_DEPARTMENT_OTHER): Payer: Self-pay

## 2024-10-18 NOTE — Telephone Encounter (Signed)
° °  Patient Name: Joy Le  DOB: February 15, 1976 MRN: [id number]  Primary Cardiologist: Jerel Balding, MD  Chart reviewed as part of pre-operative protocol coverage. Given past medical history and time since last visit, based on ACC/AHA guidelines, [name] is at acceptable risk for the planned procedure without further cardiovascular testing.   Per Dr. Balding 10/18/2024 Low risk for colonoscopy. Okay to hold Eliquis  for 2-3 days before the procedure. Thank you   The patient was advised that if she develops new symptoms prior to surgery to contact our office to arrange for a follow-up visit, and she verbalized understanding.  I will route this recommendation to the requesting party via Epic fax function and remove from pre-op pool.  Please call with questions.  Lamarr Satterfield, NP 10/18/2024, 12:19 PM

## 2024-10-18 NOTE — Telephone Encounter (Signed)
° °  Pre-operative Risk Assessment    Patient Name: Joy Le  DOB: 06/30/1976 MRN: [id number]   Date of last office visit: 09/30/24 with Croitoru  Date of next office visit: NA  Request for Surgical Clearance    Procedure:  Colonoscopy   Date of Surgery:  Clearance TBD                                 Surgeon:  Dr. Gunnar Daniels Surgeon's Group or Practice Name:  [facility] [place] Phone number:  [phone] Fax number:  [phone]   Type of Clearance Requested:   - Medical  - Pharmacy:  Hold Apixaban  (Eliquis ) 2 days prior    Type of Anesthesia:  Not Indicated   Additional requests/questions:    Joy Le   10/18/2024, 11:13 AM

## 2024-10-18 NOTE — Telephone Encounter (Signed)
 Pharmacy please advise on holding Eliquis  for 2 days prior to colonoscopy scheduled for TBD. Last labs 04/26/2024. Thank you.

## 2024-10-18 NOTE — Telephone Encounter (Signed)
 Low risk for colonoscopy.  Okay to hold Eliquis  for 2-3 days before the procedure.  Thank you

## 2024-10-18 NOTE — Telephone Encounter (Signed)
 Dr.Croitoru,  You saw this patient on 09/30/2024. Per protocol we request that you comment on his cardiac risk to proceed with colonoscopy on TBD, since it has been less than 2 months since evaluated in the office.I have reached out to pharmacy concerning Eliquis  hold.  Thank you, Lamarr Satterfield DNP, ANP, AACC.
